# Patient Record
Sex: Female | Born: 1965 | Hispanic: No | Marital: Married | Smoking: Never smoker
Health system: Southern US, Community
[De-identification: ages and names within clinical notes are randomized; demographics above are authoritative.]

## PROBLEM LIST (undated history)

## (undated) DIAGNOSIS — K219 Gastro-esophageal reflux disease without esophagitis: Secondary | ICD-10-CM

## (undated) DIAGNOSIS — M545 Low back pain, unspecified: Secondary | ICD-10-CM

## (undated) DIAGNOSIS — I10 Essential (primary) hypertension: Secondary | ICD-10-CM

## (undated) DIAGNOSIS — K589 Irritable bowel syndrome without diarrhea: Secondary | ICD-10-CM

## (undated) DIAGNOSIS — M1990 Unspecified osteoarthritis, unspecified site: Secondary | ICD-10-CM

## (undated) DIAGNOSIS — F419 Anxiety disorder, unspecified: Secondary | ICD-10-CM

## (undated) DIAGNOSIS — G43119 Migraine with aura, intractable, without status migrainosus: Secondary | ICD-10-CM

## (undated) HISTORY — DX: Gastro-esophageal reflux disease without esophagitis: K21.9

## (undated) HISTORY — PX: OTHER SURGICAL HISTORY: SHX169

## (undated) HISTORY — DX: Anxiety disorder, unspecified: F41.9

## (undated) HISTORY — DX: Low back pain, unspecified: M54.50

## (undated) HISTORY — DX: Irritable bowel syndrome without diarrhea: K58.9

## (undated) HISTORY — DX: Essential (primary) hypertension: I10

## (undated) HISTORY — DX: Migraine with aura, intractable, without status migrainosus: G43.119

---
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
## 1999-09-03 ENCOUNTER — Other Ambulatory Visit: Admission: RE | Admit: 1999-09-03 | Discharge: 1999-09-03 | Payer: Self-pay | Admitting: Gynecology

## 2017-08-10 LAB — HM DIABETES EYE EXAM

## 2018-04-07 LAB — COLOGUARD: Cologuard: NEGATIVE

## 2018-11-06 DIAGNOSIS — M545 Low back pain, unspecified: Secondary | ICD-10-CM

## 2018-11-06 DIAGNOSIS — G8929 Other chronic pain: Secondary | ICD-10-CM | POA: Insufficient documentation

## 2018-11-06 HISTORY — DX: Other chronic pain: G89.29

## 2018-11-06 HISTORY — DX: Low back pain, unspecified: M54.50

## 2019-01-03 LAB — HM MAMMOGRAPHY: HM Mammogram: NORMAL (ref 0–4)

## 2019-01-05 LAB — HM PAP SMEAR: HM Pap smear: NORMAL

## 2019-04-23 ENCOUNTER — Ambulatory Visit: Payer: Self-pay | Admitting: Family Medicine

## 2019-05-21 ENCOUNTER — Ambulatory Visit: Payer: BC Managed Care – PPO

## 2019-05-21 ENCOUNTER — Encounter: Payer: Self-pay | Admitting: Family Medicine

## 2019-05-21 ENCOUNTER — Telehealth (INDEPENDENT_AMBULATORY_CARE_PROVIDER_SITE_OTHER): Payer: BC Managed Care – PPO | Admitting: Family Medicine

## 2019-05-21 VITALS — Temp 100.4°F | Wt 205.0 lb

## 2019-05-21 DIAGNOSIS — R197 Diarrhea, unspecified: Secondary | ICD-10-CM

## 2019-05-21 DIAGNOSIS — R05 Cough: Secondary | ICD-10-CM | POA: Diagnosis not present

## 2019-05-21 DIAGNOSIS — J018 Other acute sinusitis: Secondary | ICD-10-CM | POA: Insufficient documentation

## 2019-05-21 DIAGNOSIS — R059 Cough, unspecified: Secondary | ICD-10-CM

## 2019-05-21 MED ORDER — AMOXICILLIN 875 MG PO TABS: 875.0000 mg | ORAL_TABLET | Freq: Two times a day (BID) | ORAL | 0 refills | Status: DC

## 2019-05-21 NOTE — Patient Instructions (Signed)
Amoxillin 875 mg one twice a day.  No work until covid 19 test comes back.  Recommend fluids.   COVID-19 COVID-19 is a respiratory infection that is caused by a virus called severe acute respiratory syndrome coronavirus 2 (SARS-CoV-2). The disease is also known as coronavirus disease or novel coronavirus. In some people, the virus may not cause any symptoms. In others, it may cause a serious infection. The infection can get worse quickly and can lead to complications, such as:  Pneumonia, or infection of the lungs.  Acute respiratory distress syndrome or ARDS. This is a condition in which fluid build-up in the lungs prevents the lungs from filling with air and passing oxygen into the blood.  Acute respiratory failure. This is a condition in which there is not enough oxygen passing from the lungs to the body or when carbon dioxide is not passing from the lungs out of the body.  Sepsis or septic shock. This is a serious bodily reaction to an infection.  Blood clotting problems.  Secondary infections due to bacteria or fungus.  Organ failure. This is when your body's organs stop working. The virus that causes COVID-19 is contagious. This means that it can spread from person to person through droplets from coughs and sneezes (respiratory secretions). What are the causes? This illness is caused by a virus. You may catch the virus by:  Breathing in droplets from an infected person. Droplets can be spread by a person breathing, speaking, singing, coughing, or sneezing.  Touching something, like a table or a doorknob, that was exposed to the virus (contaminated) and then touching your mouth, nose, or eyes. What increases the risk? Risk for infection You are more likely to be infected with this virus if you:  Are within 6 feet (2 meters) of a person with COVID-19.  Provide care for or live with a person who is infected with COVID-19.  Spend time in crowded indoor spaces or live in shared  housing. Risk for serious illness You are more likely to become seriously ill from the virus if you:  Are 66 years of age or older. The higher your age, the more you are at risk for serious illness.  Live in a nursing home or long-term care facility.  Have cancer.  Have a long-term (chronic) disease such as: ? Chronic lung disease, including chronic obstructive pulmonary disease or asthma. ? A long-term disease that lowers your body's ability to fight infection (immunocompromised). ? Heart disease, including heart failure, a condition in which the arteries that lead to the heart become narrow or blocked (coronary artery disease), a disease which makes the heart muscle thick, weak, or stiff (cardiomyopathy). ? Diabetes. ? Chronic kidney disease. ? Sickle cell disease, a condition in which red blood cells have an abnormal "sickle" shape. ? Liver disease.  Are obese. What are the signs or symptoms? Symptoms of this condition can range from mild to severe. Symptoms may appear any time from 2 to 14 days after being exposed to the virus. They include:  A fever or chills.  A cough.  Difficulty breathing.  Headaches, body aches, or muscle aches.  Runny or stuffy (congested) nose.  A sore throat.  New loss of taste or smell. Some people may also have stomach problems, such as nausea, vomiting, or diarrhea. Other people may not have any symptoms of COVID-19. How is this diagnosed? This condition may be diagnosed based on:  Your signs and symptoms, especially if: ? You live in an  area with a COVID-19 outbreak. ? You recently traveled to or from an area where the virus is common. ? You provide care for or live with a person who was diagnosed with COVID-19. ? You were exposed to a person who was diagnosed with COVID-19.  A physical exam.  Lab tests, which may include: ? Taking a sample of fluid from the back of your nose and throat (nasopharyngeal fluid), your nose, or your  throat using a swab. ? A sample of mucus from your lungs (sputum). ? Blood tests.  Imaging tests, which may include, X-rays, CT scan, or ultrasound. How is this treated? At present, there is no medicine to treat COVID-19. Medicines that treat other diseases are being used on a trial basis to see if they are effective against COVID-19. Your health care provider will talk with you about ways to treat your symptoms. For most people, the infection is mild and can be managed at home with rest, fluids, and over-the-counter medicines. Treatment for a serious infection usually takes places in a hospital intensive care unit (ICU). It may include one or more of the following treatments. These treatments are given until your symptoms improve.  Receiving fluids and medicines through an IV.  Supplemental oxygen. Extra oxygen is given through a tube in the nose, a face mask, or a hood.  Positioning you to lie on your stomach (prone position). This makes it easier for oxygen to get into the lungs.  Continuous positive airway pressure (CPAP) or bi-level positive airway pressure (BPAP) machine. This treatment uses mild air pressure to keep the airways open. A tube that is connected to a motor delivers oxygen to the body.  Ventilator. This treatment moves air into and out of the lungs by using a tube that is placed in your windpipe.  Tracheostomy. This is a procedure to create a hole in the neck so that a breathing tube can be inserted.  Extracorporeal membrane oxygenation (ECMO). This procedure gives the lungs a chance to recover by taking over the functions of the heart and lungs. It supplies oxygen to the body and removes carbon dioxide. Follow these instructions at home: Lifestyle  If you are sick, stay home except to get medical care. Your health care provider will tell you how long to stay home. Call your health care provider before you go for medical care.  Rest at home as told by your health care  provider.  Do not use any products that contain nicotine or tobacco, such as cigarettes, e-cigarettes, and chewing tobacco. If you need help quitting, ask your health care provider.  Return to your normal activities as told by your health care provider. Ask your health care provider what activities are safe for you. General instructions  Take over-the-counter and prescription medicines only as told by your health care provider.  Drink enough fluid to keep your urine pale yellow.  Keep all follow-up visits as told by your health care provider. This is important. How is this prevented?  There is no vaccine to help prevent COVID-19 infection. However, there are steps you can take to protect yourself and others from this virus. To protect yourself:   Do not travel to areas where COVID-19 is a risk. The areas where COVID-19 is reported change often. To identify high-risk areas and travel restrictions, check the CDC travel website: StageSync.si  If you live in, or must travel to, an area where COVID-19 is a risk, take precautions to avoid infection. ? Stay away  from people who are sick. ? Wash your hands often with soap and water for 20 seconds. If soap and water are not available, use an alcohol-based hand sanitizer. ? Avoid touching your mouth, face, eyes, or nose. ? Avoid going out in public, follow guidance from your state and local health authorities. ? If you must go out in public, wear a cloth face covering or face mask. Make sure your mask covers your nose and mouth. ? Avoid crowded indoor spaces. Stay at least 6 feet (2 meters) away from others. ? Disinfect objects and surfaces that are frequently touched every day. This may include:  Counters and tables.  Doorknobs and light switches.  Sinks and faucets.  Electronics, such as phones, remote controls, keyboards, computers, and tablets. To protect others: If you have symptoms of COVID-19, take steps to prevent  the virus from spreading to others.  If you think you have a COVID-19 infection, contact your health care provider right away. Tell your health care team that you think you may have a COVID-19 infection.  Stay home. Leave your house only to seek medical care. Do not use public transport.  Do not travel while you are sick.  Wash your hands often with soap and water for 20 seconds. If soap and water are not available, use alcohol-based hand sanitizer.  Stay away from other members of your household. Let healthy household members care for children and pets, if possible. If you have to care for children or pets, wash your hands often and wear a mask. If possible, stay in your own room, separate from others. Use a different bathroom.  Make sure that all people in your household wash their hands well and often.  Cough or sneeze into a tissue or your sleeve or elbow. Do not cough or sneeze into your hand or into the air.  Wear a cloth face covering or face mask. Make sure your mask covers your nose and mouth. Where to find more information  Centers for Disease Control and Prevention: PurpleGadgets.be  World Health Organization: https://www.castaneda.info/ Contact a health care provider if:  You live in or have traveled to an area where COVID-19 is a risk and you have symptoms of the infection.  You have had contact with someone who has COVID-19 and you have symptoms of the infection. Get help right away if:  You have trouble breathing.  You have pain or pressure in your chest.  You have confusion.  You have bluish lips and fingernails.  You have difficulty waking from sleep.  You have symptoms that get worse. These symptoms may represent a serious problem that is an emergency. Do not wait to see if the symptoms will go away. Get medical help right away. Call your local emergency services (911 in the [place] Do not drive yourself to the hospital.  Let the emergency medical personnel know if you think you have COVID-19. Summary  COVID-19 is a respiratory infection that is caused by a virus. It is also known as coronavirus disease or novel coronavirus. It can cause serious infections, such as pneumonia, acute respiratory distress syndrome, acute respiratory failure, or sepsis.  The virus that causes COVID-19 is contagious. This means that it can spread from person to person through droplets from breathing, speaking, singing, coughing, or sneezing.  You are more likely to develop a serious illness if you are 75 years of age or older, have a weak immune system, live in a nursing home, or have chronic disease.  There is no medicine to treat COVID-19. Your health care provider will talk with you about ways to treat your symptoms.  Take steps to protect yourself and others from infection. Wash your hands often and disinfect objects and surfaces that are frequently touched every day. Stay away from people who are sick and wear a mask if you are sick. This information is not intended to replace advice given to you by your health care provider. Make sure you discuss any questions you have with your health care provider. Document Revised: 01/26/2019 Document Reviewed: 05/04/2018 Elsevier Patient Education  [employer].

## 2019-05-21 NOTE — Progress Notes (Signed)
Virtual Visit via Telephone Note   This visit type was conducted due to national recommendations for restrictions regarding the COVID-19 Pandemic (e.g. social distancing) in an effort to limit this patient's exposure and mitigate transmission in our community.  Due to her co-morbid illnesses, this patient is at least at moderate risk for complications without adequate follow up.  This format is felt to be most appropriate for this patient at this time.  The patient did have access to video technology.  All issues noted in this document were discussed and addressed.  No physical exam could be performed with this format.  Patient verbally consented to a telehealth visit.   Date:  05/25/2019   ID:  Debbie Stark, DOB Aug 26, 1965, MRN [id number]  Patient Location: Home Provider Location: Office  PCP:  Blane Ohara, MD   Evaluation Performed:  Follow-Up Visit  Chief Complaint:  sinusitis  History of Present Illness:    Debbie Stark is a 54 y.o. female with Sinus symptoms.  The patient does have symptoms concerning for COVID-19 infection (fever, chills, cough, or new shortness of breath).  The patient is complaining of nasal congestion, cough, chills, and sinus headaches. She has been febrile. She denies dypsnea or chest pain. Patient has been exposed to covid 19. 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  Her sense of taste and smell are normal.   Past Medical History:  Diagnosis Date  . Anxiety   . GERD (gastroesophageal reflux disease)   . Hypertension   . IBS (irritable bowel syndrome)    with constipation  . Low back pain   . Migraine with aura, intractable, without status migrainosus     Current Meds  Medication Sig  . AMITIZA 8 MCG capsule Take 8 mcg by mouth 2 (two) times daily.  Marland Kitchen atorvastatin (LIPITOR) 10 MG tablet Take 10 mg by mouth daily.  . cyclobenzaprine (FLEXERIL) 10 MG tablet Take 10 mg by mouth 3 (three) times daily as needed.  . famotidine  (PEPCID) 20 MG tablet Take 20 mg by mouth 2 (two) times daily.  Marland Kitchen lisinopril (ZESTRIL) 10 MG tablet Take 10 mg by mouth daily.  . meloxicam (MOBIC) 15 MG tablet Take 15 mg by mouth daily.  . promethazine (PHENERGAN) 25 MG tablet Take by mouth.  . rizatriptan (MAXALT) 10 MG tablet Take by mouth.  Marland Kitchen SAXENDA 18 MG/3ML SOPN Inject 3 mg into the skin daily.  . verapamil (CALAN-SR) 180 MG CR tablet Take 180 mg by mouth daily.  Marland Kitchen zolpidem (AMBIEN CR) 6.25 MG CR tablet Take 6.25 mg by mouth at bedtime as needed for sleep.     Allergies:   Sulfa antibiotics   Social History   Tobacco Use  . Smoking status: Never Smoker  Substance Use Topics  . Alcohol use: Yes    Comment: drinks 1-2 x per month  . Drug use: Never     Family Hx: The patient's family history includes Cancer in her mother; Diabetes in her father; Heart disease in her father; Hyperlipidemia in her father; Hypertension in her father; Stroke in her father.  ROS:   Review of Systems  Constitutional: Positive for chills, fever and malaise/fatigue.  HENT: Positive for congestion, sinus pain and sore throat. Negative for ear pain.   Respiratory: Positive for cough. Negative for shortness of breath.   Cardiovascular: Negative for chest pain.  Gastrointestinal: Positive for diarrhea.       For 2 weeks intermittently.  Musculoskeletal: Positive for myalgias.    Labs/Other Tests and Data Reviewed:    Recent Labs: No results found for requested labs within last 8760 hours.   Recent Lipid Panel No results found for: CHOL, TRIG, HDL, CHOLHDL, LDLCALC, LDLDIRECT  Wt Readings from Last 3 Encounters:  05/21/19 205 lb (93 kg)     Objective:    Vital Signs:  Temp (!) 100.4 F (38 C) Comment: 99.7 yesterday  Wt 205 lb (93 kg)   No exam done, but pt sounded hoarse on the phone.   ASSESSMENT & PLAN:    1. ACUTE MAXILLARY SINUSITIS 2. COUGH 3. DIARRHEA  COVID-19 Education: The signs and symptoms of COVID-19 were discussed  with the patient and how to seek care for testing (follow up with PCP or arrange E-visit). The importance of social distancing was discussed today.  Time:   Today, I have spent up to 10 minutes with the patient with telehealth technology discussing the above problems.     Tests Ordered: Orders Placed This Encounter  Procedures  . Novel Coronavirus, NAA ([facility])  . HM MAMMOGRAPHY  . HM PAP SMEAR  . HM DIABETES EYE EXAM    Medication Changes: Meds ordered this encounter  Medications  . amoxicillin (AMOXIL) 875 MG tablet    Sig: Take 1 tablet (875 mg total) by mouth 2 (two) times daily.    Dispense:  20 tablet    Refill:  0    Follow Up: prn  Signed, Rochel Brome, MD  05/25/2019 1:47 PM    [place]

## 2019-05-22 DIAGNOSIS — R05 Cough: Secondary | ICD-10-CM | POA: Insufficient documentation

## 2019-05-22 DIAGNOSIS — R059 Cough, unspecified: Secondary | ICD-10-CM | POA: Insufficient documentation

## 2019-05-22 DIAGNOSIS — R197 Diarrhea, unspecified: Secondary | ICD-10-CM | POA: Insufficient documentation

## 2019-05-22 LAB — NOVEL CORONAVIRUS, NAA: SARS-CoV-2, NAA: DETECTED — AB

## 2019-05-22 NOTE — Assessment & Plan Note (Signed)
Rest, fluids. Amoxicillin sent.

## 2019-05-22 NOTE — Assessment & Plan Note (Signed)
Hydration °

## 2019-05-22 NOTE — Assessment & Plan Note (Signed)
CHECKING FOR COVID 19. SXS ARE VERY CONCERNING.  MAY USE TYLENOL OR IBUPROFEN FOR FEVER.

## 2019-05-25 ENCOUNTER — Encounter: Payer: Self-pay | Admitting: Family Medicine

## 2019-06-05 ENCOUNTER — Telehealth: Payer: Self-pay

## 2019-06-05 NOTE — Telephone Encounter (Signed)
PA for Saxenda submitted and approved via covermymeds. Key: [id number]

## 2019-06-11 ENCOUNTER — Other Ambulatory Visit: Payer: Self-pay

## 2019-06-11 MED ORDER — VERAPAMIL HCL ER 180 MG PO TBCR: 180.0000 mg | EXTENDED_RELEASE_TABLET | Freq: Every day | ORAL | 0 refills | Status: DC

## 2019-06-11 MED ORDER — ATORVASTATIN CALCIUM 10 MG PO TABS: 10.0000 mg | ORAL_TABLET | Freq: Every day | ORAL | 0 refills | Status: DC

## 2019-07-12 ENCOUNTER — Other Ambulatory Visit: Payer: Self-pay

## 2019-07-12 MED ORDER — LISINOPRIL 10 MG PO TABS: 10.0000 mg | ORAL_TABLET | Freq: Every day | ORAL | 0 refills | Status: DC

## 2019-07-16 ENCOUNTER — Other Ambulatory Visit: Payer: Self-pay | Admitting: Family Medicine

## 2019-07-24 NOTE — Progress Notes (Signed)
Established Patient Office Visit  Subjective:  Patient ID: Debbie Stark, female    DOB: [birth year]  Age: 54 y.o. MRN: [id number]  CC:  Chief Complaint  Patient presents with  . Migraine  . Gastroesophageal Reflux  . Hyperlipidemia  . Hypertension  . Irritable Bowel Syndrome    HPI Pt presents for follow up of hypertension.  Date of diagnosis 05/2014.  Current nonpharmacologic treatment includes low sodium diet.  Her current cardiac medication regimen includes an ACE inhibitor ( Lisinopril 10 mg QD ) and a calcium channel blocker ( Calan SR 180 mg QD ).  Compliance with treatment has been good; she takes her medication as directed, maintains her diet and exercise regimen, and follows up as directed.  Concurrent health problems include hyperlipidemia.      Patient presents with morbid obesity Debbie Stark is currently taking Saxenda injections daily for weight loss management.  Typical diet includes low fat foods and low carbohydrate.  Debbie Stark has exercise routine.  Compliance with treatment has been good; she takes her medication as directed and follows up as directed.  Concurrent health problems include hypertension and hypercholesterolemia.  Debbie Stark has lost 16 pounds since started Saxenda. Only able to take Saxenda 1.2 mg daily. Difficulty increasing in the past to 1.8 because of belching and diarrhea.      In regard to the migraine with aura, not intractable, without status migrainosus, Debbie Stark was diagnosed with migraine headaches several years ago.  She has had prior headaches similar to this one. Typical migraine frequency is roughly every few weeks.   The duration of each episode is usually quite variable.  Typical precipitating factors include no identified factors.  The pain improves with Relpax and calan sr.  Compliance with treatment has been good; she takes her medication as directed.    Pertinent past medical history includes migraines and hypertension.      Pt presents with  hyperlipidemia.  Date of diagnosis 11/2011.  Current treatment includes Lipitor, exercise, and a low cholesterol/low fat diet.  Compliance with treatment has been good; she takes her medication as directed and follows up as directed.   Concurrent health problems include hypertension.      With regard to the irritable bowel syndrome with constipation, irritable Bowel Syndrome has been a problem for the past more than 5 years.  Has stopped linzess because was concerned because was having belching and diarrhea. Discontinued. Now is concerned saxenda at highest dose may be doing it.   Past Medical History:  Diagnosis Date  . Anxiety   . GERD (gastroesophageal reflux disease)   . Hypertension   . IBS (irritable bowel syndrome)    with constipation  . Low back pain   . Migraine with aura, intractable, without status migrainosus     History reviewed. No pertinent surgical history.  Family History  Problem Relation Age of Onset  . Cancer Mother        endometrial  . Diabetes Father   . Hyperlipidemia Father   . Hypertension Father   . Heart disease Father   . Stroke Father     Social History   Socioeconomic History  . Marital status: Unknown    Spouse name: Not on file  . Number of children: Not on file  . Years of education: Not on file  . Highest education level: Not on file  Occupational History  . Not on file  Tobacco Use  . Smoking status: Never Smoker  . Smokeless tobacco: Never  Used  Substance and Sexual Activity  . Alcohol use: Yes    Comment: drinks 1-2 x per month  . Drug use: Never  . Sexual activity: Not on file  Other Topics Concern  . Not on file  Social History Narrative  . Not on file   Social Determinants of Health   Financial Resource Strain:   . Difficulty of Paying Living Expenses:   Food Insecurity:   . Worried About Programme researcher, broadcasting/film/video in the Last Year:   . Barista in the Last Year:   Transportation Needs:   . Freight forwarder  (Medical):   Marland Kitchen Lack of Transportation (Non-Medical):   Physical Activity:   . Days of Exercise per Week:   . Minutes of Exercise per Session:   Stress:   . Feeling of Stress :   Social Connections:   . Frequency of Communication with Friends and Family:   . Frequency of Social Gatherings with Friends and Family:   . Attends Religious Services:   . Active Member of Clubs or Organizations:   . Attends Banker Meetings:   Marland Kitchen Marital Status:   Intimate Partner Violence:   . Fear of Current or Ex-Partner:   . Emotionally Abused:   Marland Kitchen Physically Abused:   . Sexually Abused:     Outpatient Medications Prior to Visit  Medication Sig Dispense Refill  . AMITIZA 8 MCG capsule Take 8 mcg by mouth 2 (two) times daily.    Marland Kitchen atorvastatin (LIPITOR) 10 MG tablet Take 1 tablet (10 mg total) by mouth daily. 90 tablet 0  . cyclobenzaprine (FLEXERIL) 10 MG tablet Take 10 mg by mouth 3 (three) times daily as needed.    . famotidine (PEPCID) 20 MG tablet TAKE 1 TABLET BY MOUTH TWICE A DAY 180 tablet 0  . levonorgestrel (MIRENA) 20 MCG/24HR IUD by Intrauterine route.    Marland Kitchen lisinopril (ZESTRIL) 10 MG tablet Take 1 tablet (10 mg total) by mouth daily. 90 tablet 0  . meloxicam (MOBIC) 15 MG tablet TAKE 1 TABLET BY MOUTH EVERY DAY 90 tablet 0  . promethazine (PHENERGAN) 25 MG tablet Take by mouth.    . rizatriptan (MAXALT) 10 MG tablet Take by mouth.    Marland Kitchen SAXENDA 18 MG/3ML SOPN Inject 3 mg into the skin daily.    . verapamil (CALAN-SR) 180 MG CR tablet Take 1 tablet (180 mg total) by mouth daily. 90 tablet 0  . zolpidem (AMBIEN CR) 6.25 MG CR tablet Take 6.25 mg by mouth at bedtime as needed for sleep.    Marland Kitchen amoxicillin (AMOXIL) 875 MG tablet Take 1 tablet (875 mg total) by mouth 2 (two) times daily. 20 tablet 0   No facility-administered medications prior to visit.    Allergies  Allergen Reactions  . Sulfa Antibiotics     ROS Review of Systems  Constitutional: Negative for chills,  diaphoresis, fatigue and fever.  HENT: Negative for congestion, ear pain, rhinorrhea and sore throat.   Respiratory: Negative for cough and shortness of breath.   Cardiovascular: Negative for chest pain.  Gastrointestinal: Negative for abdominal pain, nausea and vomiting. Diarrhea: Every time she titrates up on Saxenda.  Genitourinary: Negative for dysuria and urgency.  Musculoskeletal: Positive for arthralgias and back pain. Negative for myalgias.       Right medial ankle painful and swollen since Sunday. Took meloxicam on Sunday. Helped a little. Low back pain intermittent. Uses meloxicam intermittently and it helps.   Neurological:  Dizziness: Sometimes.       Had a spell of feeling weak and felt like she was going to pass out. Ate a little something and laid down and felt better after about 15 minutes. Had a headache right frontal area radiating across frontal to left side.   Psychiatric/Behavioral: Negative for dysphoric mood. The patient is not nervous/anxious.       Objective:    Physical Exam  Constitutional: She appears well-developed and well-nourished.  Cardiovascular: Normal rate, regular rhythm and normal heart sounds.  Pulmonary/Chest: Effort normal and breath sounds normal.  Abdominal: Soft. Bowel sounds are normal. There is no abdominal tenderness.  Musculoskeletal:        General: Tenderness present.     Comments: No swelling. Right ankle tenderness.   Neurological: She is alert.  Psychiatric: She has a normal mood and affect. Her behavior is normal.    BP 130/80 (BP Location: Left Arm, Patient Position: Sitting)   Pulse 78   Temp (!) 95.3 F (35.2 C) (Temporal)   Ht 5\' 5"  (1.651 m)   Wt 206 lb (93.4 kg)   SpO2 95%   BMI 34.28 kg/m  Wt Readings from Last 3 Encounters:  07/25/19 206 lb (93.4 kg)  05/21/19 205 lb (93 kg)     Health Maintenance Due  Topic Date Due  . HIV Screening  Never done  . COVID-19 Vaccine (1) Never done  . TETANUS/TDAP  Never done   . COLONOSCOPY  Never done    There are no preventive care reminders to display for this patient.  No results found for: TSH Lab Results  Component Value Date   WBC 6.6 07/25/2019   HGB 13.6 07/25/2019   HCT 41.5 07/25/2019   MCV 98 (H) 07/25/2019   PLT 215 07/25/2019   Lab Results  Component Value Date   NA 140 07/25/2019   K 4.8 07/25/2019   CO2 22 07/25/2019   GLUCOSE 86 07/25/2019   BUN 16 07/25/2019   CREATININE 1.03 (H) 07/25/2019   BILITOT 0.5 07/25/2019   ALKPHOS 69 07/25/2019   AST 11 07/25/2019   ALT 8 07/25/2019   PROT 7.2 07/25/2019   ALBUMIN 4.4 07/25/2019   CALCIUM 9.7 07/25/2019   Lab Results  Component Value Date   CHOL 131 07/25/2019   Lab Results  Component Value Date   HDL 47 07/25/2019   Lab Results  Component Value Date   LDLCALC 72 07/25/2019   Lab Results  Component Value Date   TRIG 52 07/25/2019   Lab Results  Component Value Date   CHOLHDL 2.8 07/25/2019   No results found for: HGBA1C    Assessment & Plan:  1. Essential hypertension Well controlled.  No changes to medicines.  Continue to work on eating a healthy diet and exercise.  Labs drawn today. - CBC with Differential/Platelet - Comprehensive metabolic panel  2. Mixed hyperlipidemia Well controlled.  No changes to medicines.  Continue to work on eating a healthy diet and exercise.  Labs drawn today.  - Lipid panel  3. Acute right ankle pain Wear supportive shoes. Start meloxicm 15 mg once daily.   4. Irritable bowel syndrome with diarrhea Continue amitiza.  5. Class 1 obesity due to excess calories with serious comorbidity and body mass index (BMI) of 34.0 to 34.9 in adult  Continue Saxenda. Recommend pt try 1.2 mg in am and 0.6 mg in evening. Continue to eat healthy and exercise.     Follow-up: Return in  about 3 months (around 10/24/2019) for fasting.    Blane Ohara, MD

## 2019-07-25 ENCOUNTER — Encounter: Payer: Self-pay | Admitting: Family Medicine

## 2019-07-25 ENCOUNTER — Other Ambulatory Visit: Payer: Self-pay

## 2019-07-25 ENCOUNTER — Ambulatory Visit: Payer: BC Managed Care – PPO | Admitting: Family Medicine

## 2019-07-25 VITALS — BP 130/80 | HR 78 | Temp 95.3°F | Ht 65.0 in | Wt 206.0 lb

## 2019-07-25 DIAGNOSIS — M25571 Pain in right ankle and joints of right foot: Secondary | ICD-10-CM

## 2019-07-25 DIAGNOSIS — K580 Irritable bowel syndrome with diarrhea: Secondary | ICD-10-CM | POA: Insufficient documentation

## 2019-07-25 DIAGNOSIS — E782 Mixed hyperlipidemia: Secondary | ICD-10-CM

## 2019-07-25 DIAGNOSIS — Z6834 Body mass index (BMI) 34.0-34.9, adult: Secondary | ICD-10-CM

## 2019-07-25 DIAGNOSIS — I10 Essential (primary) hypertension: Secondary | ICD-10-CM

## 2019-07-25 DIAGNOSIS — E6609 Other obesity due to excess calories: Secondary | ICD-10-CM

## 2019-07-25 DIAGNOSIS — K219 Gastro-esophageal reflux disease without esophagitis: Secondary | ICD-10-CM | POA: Insufficient documentation

## 2019-07-25 HISTORY — DX: Mixed hyperlipidemia: E78.2

## 2019-07-25 HISTORY — DX: Essential (primary) hypertension: I10

## 2019-07-25 NOTE — Patient Instructions (Addendum)
Increase Saxenda to 1.2 in am and 0.6 in evening.  Right ankle - wear supportive shoes and meloxicam 15 mg once daily. Continue other medications.  DASH Eating Plan DASH stands for "Dietary Approaches to Stop Hypertension." The DASH eating plan is a healthy eating plan that has been shown to reduce high blood pressure (hypertension). It may also reduce your risk for type 2 diabetes, heart disease, and stroke. The DASH eating plan may also help with weight loss. What are tips for following this plan?  General guidelines  Avoid eating more than 2,300 mg (milligrams) of salt (sodium) a day. If you have hypertension, you may need to reduce your sodium intake to 1,500 mg a day.  Limit alcohol intake to no more than 1 drink a day for nonpregnant women and 2 drinks a day for men. One drink equals 12 oz of beer, 5 oz of wine, or 1 oz of hard liquor.  Work with your health care provider to maintain a healthy body weight or to lose weight. Ask what an ideal weight is for you.  Get at least 30 minutes of exercise that causes your heart to beat faster (aerobic exercise) most days of the week. Activities may include walking, swimming, or biking.  Work with your health care provider or diet and nutrition specialist (dietitian) to adjust your eating plan to your individual calorie needs. Reading food labels   Check food labels for the amount of sodium per serving. Choose foods with less than 5 percent of the Daily Value of sodium. Generally, foods with less than 300 mg of sodium per serving fit into this eating plan.  To find whole grains, look for the word "whole" as the first word in the ingredient list. Shopping  Buy products labeled as "low-sodium" or "no salt added."  Buy fresh foods. Avoid canned foods and premade or frozen meals. Cooking  Avoid adding salt when cooking. Use salt-free seasonings or herbs instead of table salt or sea salt. Check with your health care provider or pharmacist  before using salt substitutes.  Do not fry foods. Cook foods using healthy methods such as baking, boiling, grilling, and broiling instead.  Cook with heart-healthy oils, such as olive, canola, soybean, or sunflower oil. Meal planning  Eat a balanced diet that includes: ? 5 or more servings of fruits and vegetables each day. At each meal, try to fill half of your plate with fruits and vegetables. ? Up to 6-8 servings of whole grains each day. ? Less than 6 oz of lean meat, poultry, or fish each day. A 3-oz serving of meat is about the same size as a deck of cards. One egg equals 1 oz. ? 2 servings of low-fat dairy each day. ? A serving of nuts, seeds, or beans 5 times each week. ? Heart-healthy fats. Healthy fats called Omega-3 fatty acids are found in foods such as flaxseeds and coldwater fish, like sardines, salmon, and mackerel.  Limit how much you eat of the following: ? Canned or prepackaged foods. ? Food that is high in trans fat, such as fried foods. ? Food that is high in saturated fat, such as fatty meat. ? Sweets, desserts, sugary drinks, and other foods with added sugar. ? Full-fat dairy products.  Do not salt foods before eating.  Try to eat at least 2 vegetarian meals each week.  Eat more home-cooked food and less restaurant, buffet, and fast food.  When eating at a restaurant, ask that your food be  prepared with less salt or no salt, if possible. What foods are recommended? The items listed may not be a complete list. Talk with your dietitian about what dietary choices are best for you. Grains Whole-grain or whole-wheat bread. Whole-grain or whole-wheat pasta. Brown rice. [place]. Bulgur. Whole-grain and low-sodium cereals. Pita bread. Low-fat, low-sodium crackers. Whole-wheat flour tortillas. Vegetables Fresh or frozen vegetables (raw, steamed, roasted, or grilled). Low-sodium or reduced-sodium tomato and vegetable juice. Low-sodium or reduced-sodium tomato  sauce and tomato paste. Low-sodium or reduced-sodium canned vegetables. Fruits All fresh, dried, or frozen fruit. Canned fruit in natural juice (without added sugar). Meat and other protein foods Skinless chicken or [place]. Ground chicken or [place]. Pork with fat trimmed off. Fish and seafood. Egg whites. Dried beans, peas, or lentils. Unsalted nuts, nut butters, and seeds. Unsalted canned beans. Lean cuts of beef with fat trimmed off. Low-sodium, lean deli meat. Dairy Low-fat (1%) or fat-free (skim) milk. Fat-free, low-fat, or reduced-fat cheeses. Nonfat, low-sodium ricotta or cottage cheese. Low-fat or nonfat yogurt. Low-fat, low-sodium cheese. Fats and oils Soft margarine without trans fats. Vegetable oil. Low-fat, reduced-fat, or light mayonnaise and salad dressings (reduced-sodium). Canola, safflower, olive, soybean, and sunflower oils. Avocado. Seasoning and other foods Herbs. Spices. Seasoning mixes without salt. Unsalted popcorn and pretzels. Fat-free sweets. What foods are not recommended? The items listed may not be a complete list. Talk with your dietitian about what dietary choices are best for you. Grains Baked goods made with fat, such as croissants, muffins, or some breads. Dry pasta or rice meal packs. Vegetables Creamed or fried vegetables. Vegetables in a cheese sauce. Regular canned vegetables (not low-sodium or reduced-sodium). Regular canned tomato sauce and paste (not low-sodium or reduced-sodium). Regular tomato and vegetable juice (not low-sodium or reduced-sodium). Angie Fava. Olives. Fruits Canned fruit in a light or heavy syrup. Fried fruit. Fruit in cream or butter sauce. Meat and other protein foods Fatty cuts of meat. Ribs. Fried meat. Berniece Salines. Sausage. Bologna and other processed lunch meats. Salami. Fatback. Hotdogs. Bratwurst. Salted nuts and seeds. Canned beans with added salt. Canned or smoked fish. Whole eggs or egg yolks. Chicken or [place] with skin. Dairy Whole  or 2% milk, cream, and half-and-half. Whole or full-fat cream cheese. Whole-fat or sweetened yogurt. Full-fat cheese. Nondairy creamers. Whipped toppings. Processed cheese and cheese spreads. Fats and oils Butter. Stick margarine. Lard. Shortening. Ghee. Bacon fat. Tropical oils, such as coconut, palm kernel, or palm oil. Seasoning and other foods Salted popcorn and pretzels. Onion salt, garlic salt, seasoned salt, table salt, and sea salt. Worcestershire sauce. Tartar sauce. Barbecue sauce. Teriyaki sauce. Soy sauce, including reduced-sodium. Steak sauce. Canned and packaged gravies. Fish sauce. Oyster sauce. Cocktail sauce. Horseradish that you find on the shelf. Ketchup. Mustard. Meat flavorings and tenderizers. Bouillon cubes. Hot sauce and Tabasco sauce. Premade or packaged marinades. Premade or packaged taco seasonings. Relishes. Regular salad dressings. Where to find more information:  National Heart, Lung, and [place]: https://wilson-eaton.com/  American Heart Association: www.heart.org Summary  The DASH eating plan is a healthy eating plan that has been shown to reduce high blood pressure (hypertension). It may also reduce your risk for type 2 diabetes, heart disease, and stroke.  With the DASH eating plan, you should limit salt (sodium) intake to 2,300 mg a day. If you have hypertension, you may need to reduce your sodium intake to 1,500 mg a day.  When on the DASH eating plan, aim to eat more fresh fruits and vegetables, whole grains,  lean proteins, low-fat dairy, and heart-healthy fats.  Work with your health care provider or diet and nutrition specialist (dietitian) to adjust your eating plan to your individual calorie needs. This information is not intended to replace advice given to you by your health care provider. Make sure you discuss any questions you have with your health care provider. Document Revised: 03/11/2017 Document Reviewed: 03/22/2016 Elsevier Patient Education   2020 [employer].  Diabetes Mellitus and Nutrition, Adult When you have diabetes (diabetes mellitus), it is very important to have healthy eating habits because your blood sugar (glucose) levels are greatly affected by what you eat and drink. Eating healthy foods in the appropriate amounts, at about the same times every day, can help you:  Control your blood glucose.  Lower your risk of heart disease.  Improve your blood pressure.  Reach or maintain a healthy weight. Every person with diabetes is different, and each person has different needs for a meal plan. Your health care provider may recommend that you work with a diet and nutrition specialist (dietitian) to make a meal plan that is best for you. Your meal plan may vary depending on factors such as:  The calories you need.  The medicines you take.  Your weight.  Your blood glucose, blood pressure, and cholesterol levels.  Your activity level.  Other health conditions you have, such as heart or kidney disease. How do carbohydrates affect me? Carbohydrates, also called carbs, affect your blood glucose level more than any other type of food. Eating carbs naturally raises the amount of glucose in your blood. Carb counting is a method for keeping track of how many carbs you eat. Counting carbs is important to keep your blood glucose at a healthy level, especially if you use insulin or take certain oral diabetes medicines. It is important to know how many carbs you can safely have in each meal. This is different for every person. Your dietitian can help you calculate how many carbs you should have at each meal and for each snack. Foods that contain carbs include:  Bread, cereal, rice, pasta, and crackers.  Potatoes and corn.  Peas, beans, and lentils.  Milk and yogurt.  Fruit and juice.  Desserts, such as cakes, cookies, ice cream, and candy. How does alcohol affect me? Alcohol can cause a sudden decrease in blood glucose  (hypoglycemia), especially if you use insulin or take certain oral diabetes medicines. Hypoglycemia can be a life-threatening condition. Symptoms of hypoglycemia (sleepiness, dizziness, and confusion) are similar to symptoms of having too much alcohol. If your health care provider says that alcohol is safe for you, follow these guidelines:  Limit alcohol intake to no more than 1 drink per day for nonpregnant women and 2 drinks per day for men. One drink equals 12 oz of beer, 5 oz of wine, or 1 oz of hard liquor.  Do not drink on an empty stomach.  Keep yourself hydrated with water, diet soda, or unsweetened iced tea.  Keep in mind that regular soda, juice, and other mixers may contain a lot of sugar and must be counted as carbs. What are tips for following this plan?  Reading food labels  Start by checking the serving size on the "Nutrition Facts" label of packaged foods and drinks. The amount of calories, carbs, fats, and other nutrients listed on the label is based on one serving of the item. Many items contain more than one serving per package.  Check the total grams (g) of  carbs in one serving. You can calculate the number of servings of carbs in one serving by dividing the total carbs by 15. For example, if a food has 30 g of total carbs, it would be equal to 2 servings of carbs.  Check the number of grams (g) of saturated and trans fats in one serving. Choose foods that have low or no amount of these fats.  Check the number of milligrams (mg) of salt (sodium) in one serving. Most people should limit total sodium intake to less than 2,300 mg per day.  Always check the nutrition information of foods labeled as "low-fat" or "nonfat". These foods may be higher in added sugar or refined carbs and should be avoided.  Talk to your dietitian to identify your daily goals for nutrients listed on the label. Shopping  Avoid buying canned, premade, or processed foods. These foods tend to be high  in fat, sodium, and added sugar.  Shop around the outside edge of the grocery store. This includes fresh fruits and vegetables, bulk grains, fresh meats, and fresh dairy. Cooking  Use low-heat cooking methods, such as baking, instead of high-heat cooking methods like deep frying.  Cook using healthy oils, such as olive, canola, or sunflower oil.  Avoid cooking with butter, cream, or high-fat meats. Meal planning  Eat meals and snacks regularly, preferably at the same times every day. Avoid going long periods of time without eating.  Eat foods high in fiber, such as fresh fruits, vegetables, beans, and whole grains. Talk to your dietitian about how many servings of carbs you can eat at each meal.  Eat 4-6 ounces (oz) of lean protein each day, such as lean meat, chicken, fish, eggs, or tofu. One oz of lean protein is equal to: ? 1 oz of meat, chicken, or fish. ? 1 egg. ?  cup of tofu.  Eat some foods each day that contain healthy fats, such as avocado, nuts, seeds, and fish. Lifestyle  Check your blood glucose regularly.  Exercise regularly as told by your health care provider. This may include: ? 150 minutes of moderate-intensity or vigorous-intensity exercise each week. This could be brisk walking, biking, or water aerobics. ? Stretching and doing strength exercises, such as yoga or weightlifting, at least 2 times a week.  Take medicines as told by your health care provider.  Do not use any products that contain nicotine or tobacco, such as cigarettes and e-cigarettes. If you need help quitting, ask your health care provider.  Work with a Veterinary surgeon or diabetes educator to identify strategies to manage stress and any emotional and social challenges. Questions to ask a health care provider  Do I need to meet with a diabetes educator?  Do I need to meet with a dietitian?  What number can I call if I have questions?  When are the best times to check my blood glucose? Where to  find more information:  American Diabetes Association: diabetes.org  Academy of Nutrition and Dietetics: www.eatright.[employer] of Diabetes and Digestive and Kidney Diseases (NIH): CarFlippers.tn Summary  A healthy meal plan will help you control your blood glucose and maintain a healthy lifestyle.  Working with a diet and nutrition specialist (dietitian) can help you make a meal plan that is best for you.  Keep in mind that carbohydrates (carbs) and alcohol have immediate effects on your blood glucose levels. It is important to count carbs and to use alcohol carefully. This information is not intended to replace  advice given to you by your health care provider. Make sure you discuss any questions you have with your health care provider. Document Revised: 03/11/2017 Document Reviewed: 05/03/2016 Elsevier Patient Education  2020 [employer].

## 2019-07-26 LAB — COMPREHENSIVE METABOLIC PANEL
ALT: 8 IU/L (ref 0–32)
AST: 11 IU/L (ref 0–40)
Albumin/Globulin Ratio: 1.6 (ref 1.2–2.2)
Albumin: 4.4 g/dL (ref 3.8–4.9)
Alkaline Phosphatase: 69 IU/L (ref 39–117)
BUN/Creatinine Ratio: 16 (ref 9–23)
BUN: 16 mg/dL (ref 6–24)
Bilirubin Total: 0.5 mg/dL (ref 0.0–1.2)
CO2: 22 mmol/L (ref 20–29)
Calcium: 9.7 mg/dL (ref 8.7–10.2)
Chloride: 103 mmol/L (ref 96–106)
Creatinine, Ser: 1.03 mg/dL — ABNORMAL HIGH (ref 0.57–1.00)
GFR calc Af Amer: 73 mL/min/{1.73_m2} (ref >59)
GFR calc non Af Amer: 63 mL/min/{1.73_m2} (ref >59)
Globulin, Total: 2.8 g/dL (ref 1.5–4.5)
Glucose: 86 mg/dL (ref 65–99)
Potassium: 4.8 mmol/L (ref 3.5–5.2)
Sodium: 140 mmol/L (ref 134–144)
Total Protein: 7.2 g/dL (ref 6.0–8.5)

## 2019-07-26 LAB — CBC WITH DIFFERENTIAL/PLATELET
Basophils Absolute: 0 10*3/uL (ref 0.0–0.2)
Basos: 1 %
EOS (ABSOLUTE): 0.2 10*3/uL (ref 0.0–0.4)
Eos: 3 %
Hematocrit: 41.5 % (ref 34.0–46.6)
Hemoglobin: 13.6 g/dL (ref 11.1–15.9)
Immature Grans (Abs): 0 10*3/uL (ref 0.0–0.1)
Immature Granulocytes: 0 %
Lymphocytes Absolute: 1.5 10*3/uL (ref 0.7–3.1)
Lymphs: 23 %
MCH: 32.2 pg (ref 26.6–33.0)
MCHC: 32.8 g/dL (ref 31.5–35.7)
MCV: 98 fL — ABNORMAL HIGH (ref 79–97)
Monocytes Absolute: 0.4 10*3/uL (ref 0.1–0.9)
Monocytes: 6 %
Neutrophils Absolute: 4.4 10*3/uL (ref 1.4–7.0)
Neutrophils: 67 %
Platelets: 215 10*3/uL (ref 150–450)
RBC: 4.23 x10E6/uL (ref 3.77–5.28)
RDW: 12.1 % (ref 11.7–15.4)
WBC: 6.6 10*3/uL (ref 3.4–10.8)

## 2019-07-26 LAB — CARDIOVASCULAR RISK ASSESSMENT

## 2019-07-26 LAB — LIPID PANEL
Chol/HDL Ratio: 2.8 ratio (ref 0.0–4.4)
Cholesterol, Total: 131 mg/dL (ref 100–199)
HDL: 47 mg/dL (ref >39)
LDL Chol Calc (NIH): 72 mg/dL (ref 0–99)
Triglycerides: 52 mg/dL (ref 0–149)
VLDL Cholesterol Cal: 12 mg/dL (ref 5–40)

## 2019-07-29 DIAGNOSIS — Z6838 Body mass index (BMI) 38.0-38.9, adult: Secondary | ICD-10-CM

## 2019-07-29 DIAGNOSIS — E66812 Obesity, class 2: Secondary | ICD-10-CM

## 2019-07-29 DIAGNOSIS — E6609 Other obesity due to excess calories: Secondary | ICD-10-CM | POA: Insufficient documentation

## 2019-07-29 HISTORY — DX: Body mass index (BMI) 38.0-38.9, adult: Z68.38

## 2019-07-29 HISTORY — DX: Morbid (severe) obesity due to excess calories: E66.01

## 2019-07-29 HISTORY — DX: Obesity, class 2: E66.812

## 2019-08-20 ENCOUNTER — Other Ambulatory Visit: Payer: Self-pay

## 2019-08-21 MED ORDER — SAXENDA 18 MG/3ML ~~LOC~~ SOPN: 3.0000 mg | PEN_INJECTOR | Freq: Every day | SUBCUTANEOUS | 2 refills | Status: DC

## 2019-08-24 ENCOUNTER — Other Ambulatory Visit: Payer: Self-pay

## 2019-08-24 MED ORDER — FAMOTIDINE 20 MG PO TABS: 20.0000 mg | ORAL_TABLET | Freq: Two times a day (BID) | ORAL | 0 refills | Status: DC

## 2019-09-01 ENCOUNTER — Other Ambulatory Visit: Payer: Self-pay | Admitting: Family Medicine

## 2019-10-02 ENCOUNTER — Other Ambulatory Visit: Payer: Self-pay | Admitting: Family Medicine

## 2019-10-09 ENCOUNTER — Other Ambulatory Visit: Payer: Self-pay | Admitting: Family Medicine

## 2019-10-29 ENCOUNTER — Other Ambulatory Visit: Payer: Self-pay

## 2019-10-29 ENCOUNTER — Ambulatory Visit: Payer: BC Managed Care – PPO | Admitting: Family Medicine

## 2019-10-29 ENCOUNTER — Encounter: Payer: Self-pay | Admitting: Family Medicine

## 2019-10-29 VITALS — BP 128/74 | HR 78 | Temp 97.6°F | Resp 18 | Ht 65.0 in | Wt 217.8 lb

## 2019-10-29 DIAGNOSIS — E782 Mixed hyperlipidemia: Secondary | ICD-10-CM

## 2019-10-29 DIAGNOSIS — R1084 Generalized abdominal pain: Secondary | ICD-10-CM

## 2019-10-29 DIAGNOSIS — I10 Essential (primary) hypertension: Secondary | ICD-10-CM | POA: Diagnosis not present

## 2019-10-29 MED ORDER — OMEPRAZOLE 40 MG PO CPDR
40.0000 mg | DELAYED_RELEASE_CAPSULE | Freq: Every day | ORAL | 3 refills | Status: DC
Start: 2019-10-29 — End: 2020-02-04

## 2019-10-29 NOTE — Patient Instructions (Addendum)
Avoid preservatives in food, go with fresh or frozen foods. Avoid spicy foods. Miralax every other day to aide in constipation. Gallbladder ultrasound to evaluate upper abdominal pain if pain continues.  Food Choices for Gastroesophageal Reflux Disease, Adult When you have gastroesophageal reflux disease (GERD), the foods you eat and your eating habits are very important. Choosing the right foods can help ease your discomfort. Think about working with a nutrition specialist (dietitian) to help you make good choices. What are tips for following this plan?  Meals  Choose healthy foods that are low in fat, such as fruits, vegetables, whole grains, low-fat dairy products, and lean meat, fish, and poultry.  Eat small meals often instead of 3 large meals a day. Eat your meals slowly, and in a place where you are relaxed. Avoid bending over or lying down until 2-3 hours after eating.  Avoid eating meals 2-3 hours before bed.  Avoid drinking a lot of liquid with meals.  Cook foods using methods other than frying. Bake, grill, or broil food instead.  Avoid or limit: ? Chocolate. ? Peppermint or spearmint. ? Alcohol. ? Pepper. ? Black and decaffeinated coffee. ? Black and decaffeinated tea. ? Bubbly (carbonated) soft drinks. ? Caffeinated energy drinks and soft drinks.  Limit high-fat foods such as: ? Fatty meat or fried foods. ? Whole milk, cream, butter, or ice cream. ? Nuts and nut butters. ? Pastries, donuts, and sweets made with butter or shortening.  Avoid foods that cause symptoms. These foods may be different for everyone. Common foods that cause symptoms include: ? Tomatoes. ? Oranges, lemons, and limes. ? Peppers. ? Spicy food. ? Onions and garlic. ? Vinegar. Lifestyle  Maintain a healthy weight. Ask your doctor what weight is healthy for you. If you need to lose weight, work with your doctor to do so safely.  Exercise for at least 30 minutes for 5 or more days each  week, or as told by your doctor.  Wear loose-fitting clothes.  Do not smoke. If you need help quitting, ask your doctor.  Sleep with the head of your bed higher than your feet. Use a wedge under the mattress or blocks under the bed frame to raise the head of the bed. Summary  When you have gastroesophageal reflux disease (GERD), food and lifestyle choices are very important in easing your symptoms.  Eat small meals often instead of 3 large meals a day. Eat your meals slowly, and in a place where you are relaxed.  Limit high-fat foods such as fatty meat or fried foods.  Avoid bending over or lying down until 2-3 hours after eating.  Avoid peppermint and spearmint, caffeine, alcohol, and chocolate. This information is not intended to replace advice given to you by your health care provider. Make sure you discuss any questions you have with your health care provider. Document Revised: 07/20/2018 Document Reviewed: 05/04/2016 Elsevier Patient Education  2020 Else[employer] Liraglutide injection (Weight Management) What is this medicine? LIRAGLUTIDE (LIR a GLOO tide) is used to help people lose weight and maintain weight loss. It is used with a reduced-calorie diet and exercise. This medicine may be used for other purposes; ask your health care provider or pharmacist if you have questions. COMMON BRAND NAME(S): Saxenda What should I tell my health care provider before I take this medicine? They need to know if you have any of these conditions:  endocrine tumors (MEN 2) or if someone in your family had these tumors  gallbladder disease  high cholesterol  history of alcohol abuse problem  history of pancreatitis  kidney disease or if you are on dialysis  liver disease  previous swelling of the tongue, face, or lips with difficulty breathing, difficulty swallowing, hoarseness, or tightening of the throat  stomach problems  suicidal thoughts, plans, or attempt; a previous  suicide attempt by you or a family member  thyroid cancer or if someone in your family had thyroid cancer  an unusual or allergic reaction to liraglutide, other medicines, foods, dyes, or preservatives  pregnant or trying to get pregnant  breast-feeding How should I use this medicine? This medicine is for injection under the skin of your upper leg, stomach area, or upper arm. You will be taught how to prepare and give this medicine. Use exactly as directed. Take your medicine at regular intervals. Do not take it more often than directed. This drug comes with INSTRUCTIONS FOR USE. Ask your pharmacist for directions on how to use this drug. Read the information carefully. Talk to your pharmacist or health care provider if you have questions. It is important that you put your used needles and syringes in a special sharps container. Do not put them in a trash can. If you do not have a sharps container, call your pharmacist or healthcare provider to get one. A special MedGuide will be given to you by the pharmacist with each prescription and refill. Be sure to read this information carefully each time. Talk to your pediatrician regarding the use of this medicine in children. Special care may be needed. Overdosage: If you think you have taken too much of this medicine contact a poison control center or emergency room at once. NOTE: This medicine is only for you. Do not share this medicine with others. What if I miss a dose? If you miss a dose, take it as soon as you can. If it is almost time for your next dose, take only that dose. Do not take double or extra doses. If you miss your dose for 3 days or more, call your doctor or health care professional to talk about how to restart this medicine. What may interact with this medicine?  insulin and other medicines for diabetes This list may not describe all possible interactions. Give your health care provider a list of all the medicines, herbs,  non-prescription drugs, or dietary supplements you use. Also tell them if you smoke, drink alcohol, or use illegal drugs. Some items may interact with your medicine. What should I watch for while using this medicine? Visit your doctor or health care professional for regular checks on your progress. Drink plenty of fluids while taking this medicine. Check with your doctor or health care professional if you get an attack of severe diarrhea, nausea, and vomiting. The loss of too much body fluid can make it dangerous for you to take this medicine. This medicine may affect blood sugar levels. Ask your healthcare provider if changes in diet or medicines are needed if you have diabetes. Patients and their families should watch out for worsening depression or thoughts of suicide. Also watch out for sudden changes in feelings such as feeling anxious, agitated, panicky, irritable, hostile, aggressive, impulsive, severely restless, overly excited and hyperactive, or not being able to sleep. If this happens, especially at the beginning of treatment or after a change in dose, call your health care professional. Women should inform their health care provider if they wish to become pregnant or think they might be pregnant. Losing  weight while pregnant is not advised and may cause harm to the unborn child. Talk to your health care provider for more information. What side effects may I notice from receiving this medicine? Side effects that you should report to your doctor or health care professional as soon as possible:  allergic reactions like skin rash, itching or hives, swelling of the face, lips, or tongue  breathing problems  diarrhea that continues or is severe  lump or swelling on the neck  severe nausea  signs and symptoms of infection like fever or chills; cough; sore throat; pain or trouble passing urine  signs and symptoms of low blood sugar such as feeling anxious; confusion; dizziness; increased  hunger; unusually weak or tired; increased sweating; shakiness; cold, clammy skin; irritable; headache; blurred vision; fast heartbeat; loss of consciousness  signs and symptoms of kidney injury like trouble passing urine or change in the amount of urine  trouble swallowing  unusual stomach upset or pain  vomiting Side effects that usually do not require medical attention (report to your doctor or health care professional if they continue or are bothersome):  constipation  decreased appetite  diarrhea  fatigue  headache  nausea  pain, redness, or irritation at site where injected  stomach upset  stuffy or runny nose This list may not describe all possible side effects. Call your doctor for medical advice about side effects. You may report side effects to FDA at 1-800-FDA-1088. Where should I keep my medicine? Keep out of the reach of children. Store unopened pen in a refrigerator between 2 and 8 degrees C (36 and 46 degrees F). Do not freeze or use if the medicine has been frozen. Protect from light and excessive heat. After you first use the pen, it can be stored at room temperature between 15 and 30 degrees C (59 and 86 degrees F) or in a refrigerator. Throw away your used pen after 30 days or after the expiration date, whichever comes first. Do not store your pen with the needle attached. If the needle is left on, medicine may leak from the pen. NOTE: This sheet is a summary. It may not cover all possible information. If you have questions about this medicine, talk to your doctor, pharmacist, or health care provider.  2020 Elsevier/Gold Standard (2019-02-01 21:16:59)

## 2019-10-29 NOTE — Progress Notes (Signed)
Subjective:  Patient ID: Debbie Stark, female    DOB: [birth year]  Age: 54 y.o. MRN: [id number]  Chief Complaint  Patient presents with  . Hypertension  . Hyperlipidemia    HPI  Recent COVID testing -negative 6/24/-televisit-pressure in sinuses-medication given, currently taking claritin Visual changes -pressure noted with big flux in blood pressure-yesterday bp after church-147/69 12:30pm then 130/72 1:03pm, 124/66 3:40, 119/67, 4:50-high headed noted with flux of blood pressure. Pt went off the second medication for 1 week-pt states less headache and pressure sensation. Taking zestril and Calan-SR Concern for GI -Abdominal Pain 2 weeks ago lasting 24hour, Nausea-Bloating. Pt wondering if symptoms related to her taking Saxenda. Pt states Tummy Tuck in the past-now with weight gain. Pt took Phentermine in the past for weight loss.  Pt states she can not associate abdominal pain with eating or rest HTN-takes zestril  And Calan daily-no concerns Cologuard -2019 Current Outpatient Medications on File Prior to Visit  Medication Sig Dispense Refill  . SAXENDA 18 MG/3ML SOPN Inject 0.5 mLs (3 mg total) into the skin daily. (Patient taking differently: Inject 1.2 mg into the skin 2 (two) times daily. ) 5 pen 2  . atorvastatin (LIPITOR) 10 MG tablet TAKE 1 TABLET(10 MG) BY MOUTH DAILY 90 tablet 0  . cyclobenzaprine (FLEXERIL) 10 MG tablet Take 10 mg by mouth 3 (three) times daily as needed.    . famotidine (PEPCID) 20 MG tablet Take 1 tablet (20 mg total) by mouth 2 (two) times daily. 180 tablet 0  . levonorgestrel (MIRENA) 20 MCG/24HR IUD by Intrauterine route.    Marland Kitchen lisinopril (ZESTRIL) 10 MG tablet TAKE 1 TABLET(10 MG) BY MOUTH DAILY 90 tablet 0  . meloxicam (MOBIC) 15 MG tablet TAKE 1 TABLET BY MOUTH EVERY DAY 90 tablet 0  . promethazine (PHENERGAN) 25 MG tablet Take by mouth.    . rizatriptan (MAXALT) 10 MG tablet Take by mouth.    . verapamil (CALAN-SR) 180 MG CR tablet TAKE 1 TABLET(180  MG) BY MOUTH DAILY 90 tablet 0  . zolpidem (AMBIEN CR) 6.25 MG CR tablet Take 6.25 mg by mouth at bedtime as needed for sleep.     No current facility-administered medications on file prior to visit.   Past Medical History:  Diagnosis Date  . Anxiety   . GERD (gastroesophageal reflux disease)   . Hypertension   . IBS (irritable bowel syndrome)    with constipation  . Low back pain   . Migraine with aura, intractable, without status migrainosus    No past surgical history on file.  Family History  Problem Relation Age of Onset  . Cancer Mother        endometrial  . Diabetes Father   . Hyperlipidemia Father   . Hypertension Father   . Heart disease Father   . Stroke Father    Social History   Socioeconomic History  . Marital status: Unknown    Spouse name: Not on file  . Number of children: Not on file  . Years of education: Not on file  . Highest education level: Not on file  Occupational History  . Not on file  Tobacco Use  . Smoking status: Never Smoker  . Smokeless tobacco: Never Used  Substance and Sexual Activity  . Alcohol use: Yes    Comment: drinks 1-2 x per month  . Drug use: Never  . Sexual activity: Not on file  Other Topics Concern  . Not on file  Social History  Narrative  . Not on file   Social Determinants of Health   Financial Resource Strain:   . Difficulty of Paying Living Expenses:   Food Insecurity:   . Worried About Programme researcher, broadcasting/film/video in the Last Year:   . Barista in the Last Year:   Transportation Needs:   . Freight forwarder (Medical):   Marland Kitchen Lack of Transportation (Non-Medical):   Physical Activity:   . Days of Exercise per Week:   . Minutes of Exercise per Session:   Stress:   . Feeling of Stress :   Social Connections:   . Frequency of Communication with Friends and Family:   . Frequency of Social Gatherings with Friends and Family:   . Attends Religious Services:   . Active Member of Clubs or Organizations:   .  Attends Banker Meetings:   Marland Kitchen Marital Status:     Review of Systems  Constitutional: Positive for appetite change. Negative for chills, fatigue and fever.  HENT: Negative for congestion, ear pain and sore throat.   Respiratory: Negative for cough and shortness of breath.   Cardiovascular: Negative for chest pain and palpitations.  Gastrointestinal: Positive for abdominal pain, nausea and vomiting. Negative for constipation and diarrhea.  Genitourinary: Negative for dysuria and urgency.  Musculoskeletal: Positive for back pain (osteoarthritis).  Neurological: Positive for headaches. Negative for dizziness.  Psychiatric/Behavioral: Negative for dysphoric mood. The patient is not nervous/anxious.      Objective:  BP 128/74   Pulse 78   Temp 97.6 F (36.4 C)   Resp 18   Ht 5\' 5"  (1.651 m)   Wt 217 lb 12.8 oz (98.8 kg)   BMI 36.24 kg/m   BP/Weight 10/29/2019 07/25/2019 05/21/2019  Systolic BP 128 130 -  Diastolic BP 74 80 -  Wt. (Lbs) 217.8 206 205  BMI 36.24 34.28 -    Physical Exam HENT:     Head: Normocephalic and atraumatic.  Cardiovascular:     Rate and Rhythm: Normal rate and regular rhythm.     Pulses: Normal pulses.     Heart sounds: Normal heart sounds.  Pulmonary:     Effort: Pulmonary effort is normal.     Breath sounds: Normal breath sounds.  Abdominal:     Palpations: Abdomen is soft.  Musculoskeletal:        General: Normal range of motion.     Cervical back: Normal range of motion and neck supple.     Right lower leg: No edema.     Left lower leg: No edema.  Neurological:     General: No focal deficit present.     Mental Status: She is alert and oriented to person, place, and time.  Psychiatric:        Mood and Affect: Mood normal.        Behavior: Behavior normal.     Lab Results  Component Value Date   WBC 6.6 07/25/2019   HGB 13.6 07/25/2019   HCT 41.5 07/25/2019   PLT 215 07/25/2019   GLUCOSE 86 07/25/2019   CHOL 131  07/25/2019   TRIG 52 07/25/2019   HDL 47 07/25/2019   LDLCALC 72 07/25/2019   ALT 8 07/25/2019   AST 11 07/25/2019   NA 140 07/25/2019   K 4.8 07/25/2019   CL 103 07/25/2019   CREATININE 1.03 (H) 07/25/2019   BUN 16 07/25/2019   CO2 22 07/25/2019      Assessment & Plan:  1. Generalized abdominal pain Concern for possible gallbladder concerns-noted nausea and vomiting - US Abdomen Limited RUQ; Future-pt declines currently - Amylase GERD in the past-increase dose to omeprazole to 40mg -rx Calendar to write abdominal pain and associated symptoms 2. Essential hypertension lisinpril- and  Calan daily-stable - CBC - Comprehensive metabolic panel  3. Mixed hyperlipidemia Lipitor -stable - Lipid panel - TSH   40 minutes discussing history, medications, weight loss, ulcers,  Follow-up:  An After Visit Summary was printed and given to the patient.  [facility] [phone]

## 2019-10-30 LAB — CBC
Hematocrit: 37.8 % (ref 34.0–46.6)
Hemoglobin: 12.5 g/dL (ref 11.1–15.9)
MCH: 32 pg (ref 26.6–33.0)
MCHC: 33.1 g/dL (ref 31.5–35.7)
MCV: 97 fL (ref 79–97)
Platelets: 190 10*3/uL (ref 150–450)
RBC: 3.91 x10E6/uL (ref 3.77–5.28)
RDW: 12.1 % (ref 11.7–15.4)
WBC: 6.4 10*3/uL (ref 3.4–10.8)

## 2019-10-30 LAB — AMYLASE: Amylase: 57 U/L (ref 31–110)

## 2019-10-30 LAB — TSH: TSH: 2.85 u[IU]/mL (ref 0.450–4.500)

## 2019-10-30 LAB — LIPID PANEL
Chol/HDL Ratio: 3.1 ratio (ref 0.0–4.4)
Cholesterol, Total: 159 mg/dL (ref 100–199)
HDL: 51 mg/dL (ref >39)
LDL Chol Calc (NIH): 94 mg/dL (ref 0–99)
Triglycerides: 74 mg/dL (ref 0–149)
VLDL Cholesterol Cal: 14 mg/dL (ref 5–40)

## 2019-10-30 LAB — COMPREHENSIVE METABOLIC PANEL
ALT: 13 IU/L (ref 0–32)
AST: 18 IU/L (ref 0–40)
Albumin/Globulin Ratio: 1.6 (ref 1.2–2.2)
Albumin: 3.9 g/dL (ref 3.8–4.9)
Alkaline Phosphatase: 70 IU/L (ref 48–121)
BUN/Creatinine Ratio: 13 (ref 9–23)
BUN: 13 mg/dL (ref 6–24)
Bilirubin Total: 0.5 mg/dL (ref 0.0–1.2)
CO2: 25 mmol/L (ref 20–29)
Calcium: 8.8 mg/dL (ref 8.7–10.2)
Chloride: 102 mmol/L (ref 96–106)
Creatinine, Ser: 0.99 mg/dL (ref 0.57–1.00)
GFR calc Af Amer: 76 mL/min/{1.73_m2} (ref >59)
GFR calc non Af Amer: 66 mL/min/{1.73_m2} (ref >59)
Globulin, Total: 2.4 g/dL (ref 1.5–4.5)
Glucose: 80 mg/dL (ref 65–99)
Potassium: 4.8 mmol/L (ref 3.5–5.2)
Sodium: 138 mmol/L (ref 134–144)
Total Protein: 6.3 g/dL (ref 6.0–8.5)

## 2019-10-30 LAB — CARDIOVASCULAR RISK ASSESSMENT

## 2019-11-27 ENCOUNTER — Other Ambulatory Visit: Payer: Self-pay | Admitting: Family Medicine

## 2019-12-12 ENCOUNTER — Other Ambulatory Visit: Payer: Self-pay | Admitting: Physician Assistant

## 2019-12-31 ENCOUNTER — Other Ambulatory Visit: Payer: Self-pay | Admitting: Family Medicine

## 2020-01-01 ENCOUNTER — Other Ambulatory Visit: Payer: Self-pay

## 2020-01-02 NOTE — Telephone Encounter (Signed)
Please call pt and see how she is taking it  I actually thought she was taking 1.2 in am and 0.6 in evening to decrease nausea. Kc

## 2020-01-07 ENCOUNTER — Other Ambulatory Visit: Payer: Self-pay | Admitting: Physician Assistant

## 2020-01-07 NOTE — Telephone Encounter (Signed)
Left message to call our office back.

## 2020-01-08 ENCOUNTER — Other Ambulatory Visit: Payer: Self-pay | Admitting: Family Medicine

## 2020-01-08 MED ORDER — SAXENDA 18 MG/3ML ~~LOC~~ SOPN: 1.2000 mg | PEN_INJECTOR | Freq: Every day | SUBCUTANEOUS | 0 refills | Status: DC

## 2020-01-08 MED ORDER — SAXENDA 18 MG/3ML ~~LOC~~ SOPN: 2.4000 mg | PEN_INJECTOR | SUBCUTANEOUS | 0 refills | Status: DC

## 2020-01-08 NOTE — Telephone Encounter (Signed)
New rx sent. Pt is taking 2.4 mg once weekly.

## 2020-01-08 NOTE — Telephone Encounter (Signed)
Patient called back and stated that she is taking the Saxenda only once daily in the morning and doing 2.4 and not doing the night time injection. So patient will need a new script sent to the pharmacy so she won't run out of the saxenda! [place]

## 2020-02-04 ENCOUNTER — Ambulatory Visit: Payer: BC Managed Care – PPO | Admitting: Family Medicine

## 2020-02-04 ENCOUNTER — Encounter: Payer: Self-pay | Admitting: Family Medicine

## 2020-02-04 ENCOUNTER — Other Ambulatory Visit: Payer: Self-pay

## 2020-02-04 VITALS — BP 136/88 | HR 78 | Temp 97.3°F | Resp 18 | Ht 65.0 in | Wt 217.2 lb

## 2020-02-04 DIAGNOSIS — G43109 Migraine with aura, not intractable, without status migrainosus: Secondary | ICD-10-CM | POA: Diagnosis not present

## 2020-02-04 DIAGNOSIS — M542 Cervicalgia: Secondary | ICD-10-CM

## 2020-02-04 DIAGNOSIS — Z6836 Body mass index (BMI) 36.0-36.9, adult: Secondary | ICD-10-CM

## 2020-02-04 DIAGNOSIS — E782 Mixed hyperlipidemia: Secondary | ICD-10-CM

## 2020-02-04 DIAGNOSIS — K580 Irritable bowel syndrome with diarrhea: Secondary | ICD-10-CM

## 2020-02-04 DIAGNOSIS — K219 Gastro-esophageal reflux disease without esophagitis: Secondary | ICD-10-CM

## 2020-02-04 DIAGNOSIS — Z23 Encounter for immunization: Secondary | ICD-10-CM

## 2020-02-04 DIAGNOSIS — I10 Essential (primary) hypertension: Secondary | ICD-10-CM | POA: Diagnosis not present

## 2020-02-04 MED ORDER — OMEPRAZOLE 40 MG PO CPDR: 40.0000 mg | DELAYED_RELEASE_CAPSULE | Freq: Every day | ORAL | 3 refills | Status: DC

## 2020-02-04 MED ORDER — MELOXICAM 15 MG PO TABS: 15.0000 mg | ORAL_TABLET | Freq: Every day | ORAL | 3 refills | Status: DC

## 2020-02-04 MED ORDER — VERAPAMIL HCL ER 180 MG PO TBCR: EXTENDED_RELEASE_TABLET | ORAL | 1 refills | Status: DC

## 2020-02-04 MED ORDER — RIZATRIPTAN BENZOATE 10 MG PO TABS: ORAL_TABLET | ORAL | 1 refills | Status: DC

## 2020-02-04 MED ORDER — FAMOTIDINE 20 MG PO TABS: ORAL_TABLET | ORAL | 3 refills | Status: DC

## 2020-02-04 MED ORDER — SAXENDA 18 MG/3ML ~~LOC~~ SOPN: 2.4000 mg | PEN_INJECTOR | Freq: Every day | SUBCUTANEOUS | 3 refills | Status: DC

## 2020-02-04 NOTE — Patient Instructions (Signed)
Take maxalt earlier.  Recommend continue to work on eating healthy diet and exercise.  DASH Eating Plan DASH stands for "Dietary Approaches to Stop Hypertension." The DASH eating plan is a healthy eating plan that has been shown to reduce high blood pressure (hypertension). It may also reduce your risk for type 2 diabetes, heart disease, and stroke. The DASH eating plan may also help with weight loss. What are tips for following this plan?  General guidelines  Avoid eating more than 2,300 mg (milligrams) of salt (sodium) a day. If you have hypertension, you may need to reduce your sodium intake to 1,500 mg a day.  Limit alcohol intake to no more than 1 drink a day for nonpregnant women and 2 drinks a day for men. One drink equals 12 oz of beer, 5 oz of wine, or 1 oz of hard liquor.  Work with your health care provider to maintain a healthy body weight or to lose weight. Ask what an ideal weight is for you.  Get at least 30 minutes of exercise that causes your heart to beat faster (aerobic exercise) most days of the week. Activities may include walking, swimming, or biking.  Work with your health care provider or diet and nutrition specialist (dietitian) to adjust your eating plan to your individual calorie needs. Reading food labels   Check food labels for the amount of sodium per serving. Choose foods with less than 5 percent of the Daily Value of sodium. Generally, foods with less than 300 mg of sodium per serving fit into this eating plan.  To find whole grains, look for the word "whole" as the first word in the ingredient list. Shopping  Buy products labeled as "low-sodium" or "no salt added."  Buy fresh foods. Avoid canned foods and premade or frozen meals. Cooking  Avoid adding salt when cooking. Use salt-free seasonings or herbs instead of table salt or sea salt. Check with your health care provider or pharmacist before using salt substitutes.  Do not fry foods. Cook foods  using healthy methods such as baking, boiling, grilling, and broiling instead.  Cook with heart-healthy oils, such as olive, canola, soybean, or sunflower oil. Meal planning  Eat a balanced diet that includes: ? 5 or more servings of fruits and vegetables each day. At each meal, try to fill half of your plate with fruits and vegetables. ? Up to 6-8 servings of whole grains each day. ? Less than 6 oz of lean meat, poultry, or fish each day. A 3-oz serving of meat is about the same size as a deck of cards. One egg equals 1 oz. ? 2 servings of low-fat dairy each day. ? A serving of nuts, seeds, or beans 5 times each week. ? Heart-healthy fats. Healthy fats called Omega-3 fatty acids are found in foods such as flaxseeds and coldwater fish, like sardines, salmon, and mackerel.  Limit how much you eat of the following: ? Canned or prepackaged foods. ? Food that is high in trans fat, such as fried foods. ? Food that is high in saturated fat, such as fatty meat. ? Sweets, desserts, sugary drinks, and other foods with added sugar. ? Full-fat dairy products.  Do not salt foods before eating.  Try to eat at least 2 vegetarian meals each week.  Eat more home-cooked food and less restaurant, buffet, and fast food.  When eating at a restaurant, ask that your food be prepared with less salt or no salt, if possible. What foods are  recommended? The items listed may not be a complete list. Talk with your dietitian about what dietary choices are best for you. Grains Whole-grain or whole-wheat bread. Whole-grain or whole-wheat pasta. Brown rice. [place]. Bulgur. Whole-grain and low-sodium cereals. Pita bread. Low-fat, low-sodium crackers. Whole-wheat flour tortillas. Vegetables Fresh or frozen vegetables (raw, steamed, roasted, or grilled). Low-sodium or reduced-sodium tomato and vegetable juice. Low-sodium or reduced-sodium tomato sauce and tomato paste. Low-sodium or reduced-sodium canned  vegetables. Fruits All fresh, dried, or frozen fruit. Canned fruit in natural juice (without added sugar). Meat and other protein foods Skinless chicken or [place]. Ground chicken or [place]. Pork with fat trimmed off. Fish and seafood. Egg whites. Dried beans, peas, or lentils. Unsalted nuts, nut butters, and seeds. Unsalted canned beans. Lean cuts of beef with fat trimmed off. Low-sodium, lean deli meat. Dairy Low-fat (1%) or fat-free (skim) milk. Fat-free, low-fat, or reduced-fat cheeses. Nonfat, low-sodium ricotta or cottage cheese. Low-fat or nonfat yogurt. Low-fat, low-sodium cheese. Fats and oils Soft margarine without trans fats. Vegetable oil. Low-fat, reduced-fat, or light mayonnaise and salad dressings (reduced-sodium). Canola, safflower, olive, soybean, and sunflower oils. Avocado. Seasoning and other foods Herbs. Spices. Seasoning mixes without salt. Unsalted popcorn and pretzels. Fat-free sweets. What foods are not recommended? The items listed may not be a complete list. Talk with your dietitian about what dietary choices are best for you. Grains Baked goods made with fat, such as croissants, muffins, or some breads. Dry pasta or rice meal packs. Vegetables Creamed or fried vegetables. Vegetables in a cheese sauce. Regular canned vegetables (not low-sodium or reduced-sodium). Regular canned tomato sauce and paste (not low-sodium or reduced-sodium). Regular tomato and vegetable juice (not low-sodium or reduced-sodium). Angie Fava. Olives. Fruits Canned fruit in a light or heavy syrup. Fried fruit. Fruit in cream or butter sauce. Meat and other protein foods Fatty cuts of meat. Ribs. Fried meat. Berniece Salines. Sausage. Bologna and other processed lunch meats. Salami. Fatback. Hotdogs. Bratwurst. Salted nuts and seeds. Canned beans with added salt. Canned or smoked fish. Whole eggs or egg yolks. Chicken or [place] with skin. Dairy Whole or 2% milk, cream, and half-and-half. Whole or full-fat  cream cheese. Whole-fat or sweetened yogurt. Full-fat cheese. Nondairy creamers. Whipped toppings. Processed cheese and cheese spreads. Fats and oils Butter. Stick margarine. Lard. Shortening. Ghee. Bacon fat. Tropical oils, such as coconut, palm kernel, or palm oil. Seasoning and other foods Salted popcorn and pretzels. Onion salt, garlic salt, seasoned salt, table salt, and sea salt. Worcestershire sauce. Tartar sauce. Barbecue sauce. Teriyaki sauce. Soy sauce, including reduced-sodium. Steak sauce. Canned and packaged gravies. Fish sauce. Oyster sauce. Cocktail sauce. Horseradish that you find on the shelf. Ketchup. Mustard. Meat flavorings and tenderizers. Bouillon cubes. Hot sauce and Tabasco sauce. Premade or packaged marinades. Premade or packaged taco seasonings. Relishes. Regular salad dressings. Where to find more information:  National Heart, Lung, and [place]: https://wilson-eaton.com/  American Heart Association: www.heart.org Summary  The DASH eating plan is a healthy eating plan that has been shown to reduce high blood pressure (hypertension). It may also reduce your risk for type 2 diabetes, heart disease, and stroke.  With the DASH eating plan, you should limit salt (sodium) intake to 2,300 mg a day. If you have hypertension, you may need to reduce your sodium intake to 1,500 mg a day.  When on the DASH eating plan, aim to eat more fresh fruits and vegetables, whole grains, lean proteins, low-fat dairy, and heart-healthy fats.  Work with your health  care provider or diet and nutrition specialist (dietitian) to adjust your eating plan to your individual calorie needs. This information is not intended to replace advice given to you by your health care provider. Make sure you discuss any questions you have with your health care provider. Document Revised: 03/11/2017 Document Reviewed: 03/22/2016 Elsevier Patient Education  2020 [employer].  Migraine Headache A migraine  headache is a very strong throbbing pain on one side or both sides of your head. This type of headache can also cause other symptoms. It can last from 4 hours to 3 days. Talk with your doctor about what things may bring on (trigger) this condition. What are the causes? The exact cause of this condition is not known. This condition may be triggered or caused by:  Drinking alcohol.  Smoking.  Taking medicines, such as: ? Medicine used to treat chest pain (nitroglycerin). ? Birth control pills. ? Estrogen. ? Some blood pressure medicines.  Eating or drinking certain products.  Doing physical activity. Other things that may trigger a migraine headache include:  Having a menstrual period.  Pregnancy.  Hunger.  Stress.  Not getting enough sleep or getting too much sleep.  Weather changes.  Tiredness (fatigue). What increases the risk?  Being 74-31 years old.  Being female.  Having a family history of migraine headaches.  Being Caucasian.  Having depression or anxiety.  Being very overweight. What are the signs or symptoms?  A throbbing pain. This pain may: ? Happen in any area of the head, such as on one side or both sides. ? Make it hard to do daily activities. ? Get worse with physical activity. ? Get worse around bright lights or loud noises.  Other symptoms may include: ? Feeling sick to your stomach (nauseous). ? Vomiting. ? Dizziness. ? Being sensitive to bright lights, loud noises, or smells.  Before you get a migraine headache, you may get warning signs (an aura). An aura may include: ? Seeing flashing lights or having blind spots. ? Seeing bright spots, halos, or zigzag lines. ? Having tunnel vision or blurred vision. ? Having numbness or a tingling feeling. ? Having trouble talking. ? Having weak muscles.  Some people have symptoms after a migraine headache (postdromal phase), such as: ? Tiredness. ? Trouble thinking (concentrating). How is  this treated?  Taking medicines that: ? Relieve pain. ? Relieve the feeling of being sick to your stomach. ? Prevent migraine headaches.  Treatment may also include: ? Having acupuncture. ? Avoiding foods that bring on migraine headaches. ? Learning ways to control your body functions (biofeedback). ? Therapy to help you know and deal with negative thoughts (cognitive behavioral therapy). Follow these instructions at home: Medicines  Take over-the-counter and prescription medicines only as told by your doctor.  Ask your doctor if the medicine prescribed to you: ? Requires you to avoid driving or using heavy machinery. ? Can cause trouble pooping (constipation). You may need to take these steps to prevent or treat trouble pooping:  Drink enough fluid to keep your pee (urine) pale yellow.  Take over-the-counter or prescription medicines.  Eat foods that are high in fiber. These include beans, whole grains, and fresh fruits and vegetables.  Limit foods that are high in fat and sugar. These include fried or sweet foods. Lifestyle  Do not drink alcohol.  Do not use any products that contain nicotine or tobacco, such as cigarettes, e-cigarettes, and chewing tobacco. If you need help quitting, ask your doctor.  Get at least 8 hours of sleep every night.  Limit and deal with stress. General instructions      Keep a journal to find out what may bring on your migraine headaches. For example, write down: ? What you eat and drink. ? How much sleep you get. ? Any change in what you eat or drink. ? Any change in your medicines.  If you have a migraine headache: ? Avoid things that make your symptoms worse, such as bright lights. ? It may help to lie down in a dark, quiet room. ? Do not drive or use heavy machinery. ? Ask your doctor what activities are safe for you.  Keep all follow-up visits as told by your doctor. This is important. Contact a doctor if:  You get a  migraine headache that is different or worse than others you have had.  You have more than 15 headache days in one month. Get help right away if:  Your migraine headache gets very bad.  Your migraine headache lasts longer than 72 hours.  You have a fever.  You have a stiff neck.  You have trouble seeing.  Your muscles feel weak or like you cannot control them.  You start to lose your balance a lot.  You start to have trouble walking.  You pass out (faint).  You have a seizure. Summary  A migraine headache is a very strong throbbing pain on one side or both sides of your head. These headaches can also cause other symptoms.  This condition may be treated with medicines and changes to your lifestyle.  Keep a journal to find out what may bring on your migraine headaches.  Contact a doctor if you get a migraine headache that is different or worse than others you have had.  Contact your doctor if you have more than 15 headache days in a month. This information is not intended to replace advice given to you by your health care provider. Make sure you discuss any questions you have with your health care provider. Document Revised: 07/21/2018 Document Reviewed: 05/11/2018 Elsevier Patient Education  2020 [employer].

## 2020-02-04 NOTE — Progress Notes (Signed)
Established Patient Office Visit  Subjective:  Patient ID: Debbie Stark, female    DOB: 03-Jul-1965  Age: 54 y.o. MRN: [id number]  CC:  Chief Complaint  Patient presents with  . Hypertension    HPI Pt presents for follow up of hypertension.  Date of diagnosis 05/2014.  Current nonpharmacologic treatment includes low sodium diet.  Her current cardiac medication regimen includes an ACE inhibitor ( Lisinopril 10 mg QD ) and a calcium channel blocker ( Calan SR 180 mg QD ).  Compliance with treatment has been good; she takes her medication as directed, maintains her diet and exercise regimen, and follows up as directed.  Concurrent health problems include hyperlipidemia.      Patient presents with morbid obesity Debbie Stark is currently taking Saxenda injections daily for weight loss management.  Started at 1.2 and then has increased to 2.4. Unable to go to 3 mg in the past due to diarrhea side effect. Patient has fallen off diet. She ran out of saxenda. Restarted them and have lost 5 lbs in one week. Helps suppress her appetite. Not exercising.      In regard to the migraine with aura, not intractable, without status migrainosus, Debbie Stark was diagnosed with migraine headaches several years ago.  She has had prior headaches similar to this one. Typical migraine frequency 1-3 headaches in a week.    Takes ibuprofen.  Prophylaxis with calan sr. She is taking rizatriptan, but is concerned is expired.   Compliance with treatment has been good; she takes her medication as directed.    Pertinent past medical history includes migraines and hypertension.      Pt presents with hyperlipidemia.  Date of diagnosis 11/2011.  Current treatment includes Lipitor 10 mg once daily.  Not exercising or following a low cholesterol/low fat diet.      IBS - amitiza was nonformulary. Pt was taking linzess but was too strong or else was the saxenda. Resolved with lower dose of saxenda.   GERD: On omeprazole 40 mg daily and  pepcid 20 mg one twice a day.   Neck pain/back pain intermittently. Use meloxicam daily and flexeril prn. This is working.   Past Medical History:  Diagnosis Date  . Anxiety   . GERD (gastroesophageal reflux disease)   . Hypertension   . IBS (irritable bowel syndrome)    with constipation  . Low back pain   . Migraine with aura, intractable, without status migrainosus     History reviewed. No pertinent surgical history.  Family History  Problem Relation Age of Onset  . Cancer Mother        endometrial  . Diabetes Father   . Hyperlipidemia Father   . Hypertension Father   . Heart disease Father   . Stroke Father     Social History   Socioeconomic History  . Marital status: Unknown    Spouse name: Not on file  . Number of children: Not on file  . Years of education: Not on file  . Highest education level: Not on file  Occupational History  . Not on file  Tobacco Use  . Smoking status: Never Smoker  . Smokeless tobacco: Never Used  Substance and Sexual Activity  . Alcohol use: Yes    Comment: drinks 1-2 x per month  . Drug use: Never  . Sexual activity: Not on file  Other Topics Concern  . Not on file  Social History Narrative  . Not on file   Social Determinants  of Health   Financial Resource Strain:   . Difficulty of Paying Living Expenses: Not on file  Food Insecurity:   . Worried About Programme researcher, broadcasting/film/video in the Last Year: Not on file  . Ran Out of Food in the Last Year: Not on file  Transportation Needs:   . Lack of Transportation (Medical): Not on file  . Lack of Transportation (Non-Medical): Not on file  Physical Activity:   . Days of Exercise per Week: Not on file  . Minutes of Exercise per Session: Not on file  Stress:   . Feeling of Stress : Not on file  Social Connections:   . Frequency of Communication with Friends and Family: Not on file  . Frequency of Social Gatherings with Friends and Family: Not on file  . Attends Religious Services:  Not on file  . Active Member of Clubs or Organizations: Not on file  . Attends Banker Meetings: Not on file  . Marital Status: Not on file  Intimate Partner Violence:   . Fear of Current or Ex-Partner: Not on file  . Emotionally Abused: Not on file  . Physically Abused: Not on file  . Sexually Abused: Not on file    Outpatient Medications Prior to Visit  Medication Sig Dispense Refill  . atorvastatin (LIPITOR) 10 MG tablet TAKE 1 TABLET(10 MG) BY MOUTH DAILY 90 tablet 0  . cyclobenzaprine (FLEXERIL) 10 MG tablet Take 10 mg by mouth 3 (three) times daily as needed.    . Liraglutide -Weight Management (SAXENDA) 18 MG/3ML SOPN Inject 2.4 mg into the skin once a week. 15 mL 0  . lisinopril (ZESTRIL) 10 MG tablet TAKE 1 TABLET(10 MG) BY MOUTH DAILY 90 tablet 0  . promethazine (PHENERGAN) 25 MG tablet Take by mouth.    . zolpidem (AMBIEN CR) 6.25 MG CR tablet Take 6.25 mg by mouth at bedtime as needed for sleep.    . famotidine (PEPCID) 20 MG tablet TAKE 1 TABLET(20 MG) BY MOUTH TWICE DAILY 180 tablet 0  . levonorgestrel (MIRENA) 20 MCG/24HR IUD by Intrauterine route.    . meloxicam (MOBIC) 15 MG tablet TAKE 1 TABLET BY MOUTH EVERY DAY 90 tablet 0  . omeprazole (PRILOSEC) 40 MG capsule Take 1 capsule (40 mg total) by mouth daily. 30 capsule 3  . rizatriptan (MAXALT) 10 MG tablet Take by mouth.    Marland Kitchen SAXENDA 18 MG/3ML SOPN Inject 1.2 mg into the skin daily. 15 mL 0  . verapamil (CALAN-SR) 180 MG CR tablet TAKE 1 TABLET(180 MG) BY MOUTH DAILY 90 tablet 0   No facility-administered medications prior to visit.    Allergies  Allergen Reactions  . Sulfa Antibiotics     ROS Review of Systems  Constitutional: Negative for chills, diaphoresis, fatigue and fever.  HENT: Negative for congestion, ear pain, rhinorrhea and sore throat.   Respiratory: Negative for cough and shortness of breath.   Cardiovascular: Negative for chest pain.  Gastrointestinal: Positive for abdominal pain  (sometimes), diarrhea (sometimes. ) and nausea (sometiems). Negative for vomiting.  Genitourinary: Negative for dysuria and urgency.  Musculoskeletal: Negative for arthralgias, back pain and myalgias.  Neurological: Positive for headaches. Dizziness: Sometimes.  Psychiatric/Behavioral: Negative for dysphoric mood. The patient is not nervous/anxious.       Objective:    Physical Exam Constitutional:      Appearance: Normal appearance. She is well-developed. She is obese.  Neck:     Vascular: No carotid bruit.  Cardiovascular:  Rate and Rhythm: Normal rate and regular rhythm.     Heart sounds: Normal heart sounds.  Pulmonary:     Effort: Pulmonary effort is normal.     Breath sounds: Normal breath sounds.  Abdominal:     General: Bowel sounds are normal.     Palpations: Abdomen is soft.     Tenderness: There is no abdominal tenderness.  Musculoskeletal:     Comments: .   Neurological:     Mental Status: She is alert and oriented to person, place, and time.  Psychiatric:        Mood and Affect: Mood normal.        Behavior: Behavior normal.     BP 136/88   Pulse 78   Temp (!) 97.3 F (36.3 C)   Resp 18   Ht 5\' 5"  (1.651 m)   Wt 217 lb 3.2 oz (98.5 kg)   BMI 36.14 kg/m  Wt Readings from Last 3 Encounters:  02/04/20 217 lb 3.2 oz (98.5 kg)  10/29/19 217 lb 12.8 oz (98.8 kg)  07/25/19 206 lb (93.4 kg)     Health Maintenance Due  Topic Date Due  . Hepatitis C Screening  Never done  . COVID-19 Vaccine (1) Never done  . HIV Screening  Never done  . TETANUS/TDAP  Never done  . COLONOSCOPY  Never done    There are no preventive care reminders to display for this patient.  Lab Results  Component Value Date   TSH 2.850 10/29/2019   Lab Results  Component Value Date   WBC 6.4 10/29/2019   HGB 12.5 10/29/2019   HCT 37.8 10/29/2019   MCV 97 10/29/2019   PLT 190 10/29/2019   Lab Results  Component Value Date   NA 138 10/29/2019   K 4.8 10/29/2019   CO2  25 10/29/2019   GLUCOSE 80 10/29/2019   BUN 13 10/29/2019   CREATININE 0.99 10/29/2019   BILITOT 0.5 10/29/2019   ALKPHOS 70 10/29/2019   AST 18 10/29/2019   ALT 13 10/29/2019   PROT 6.3 10/29/2019   ALBUMIN 3.9 10/29/2019   CALCIUM 8.8 10/29/2019   Lab Results  Component Value Date   CHOL 159 10/29/2019   Lab Results  Component Value Date   HDL 51 10/29/2019   Lab Results  Component Value Date   LDLCALC 94 10/29/2019   Lab Results  Component Value Date   TRIG 74 10/29/2019   Lab Results  Component Value Date   CHOLHDL 3.1 10/29/2019   No results found for: HGBA1C    Assessment & Plan:  1. Essential hypertension Well controlled.  No changes to medicines.  Continue to work on eating a healthy diet and exercise.  Labs drawn today.  - CBC with Differential/Platelet - Comprehensive metabolic panel - verapamil (CALAN-SR) 180 MG CR tablet; TAKE 1 TABLET(180 MG) BY MOUTH DAILY  Dispense: 90 tablet; Refill: 1  2. Mixed hyperlipidemia Well controlled.  No changes to medicines.  Continue to work on eating a healthy diet and exercise.  Labs reviewed from recent life insurance check.    3. Irritable bowel syndrome with diarrhea Improved.   4. Migraine with aura and without status migrainosus, not intractable Well controlled.  No changes to medicines.  Encouraged to take maxalt earlier.  - rizatriptan (MAXALT) 10 MG tablet; Take one at onset of migraine/aura, repeat in 2 hours if not resolved. NO more than 2 in 24 hours.  Dispense: 90 tablet; Refill: 1  5.  Encounter for immunization - Flu Vaccine MDCK QUAD PF  6. Morbid obesity (HCC) Dash diet.  Recommend exercise.  - SAXENDA 18 MG/3ML SOPN; Inject 2.4 mg into the skin daily.  Dispense: 18 mL; Refill: 3  7. BMI 36.0-36.9,adult See above  8. Gastroesophageal reflux disease without esophagitis The current medical regimen is effective;  continue present plan and medications. - omeprazole (PRILOSEC) 40 MG  capsule; Take 1 capsule (40 mg total) by mouth daily.  Dispense: 90 capsule; Refill: 3 - famotidine (PEPCID) 20 MG tablet; TAKE 1 TABLET(20 MG) BY MOUTH TWICE DAILY  Dispense: 180 tablet; Refill: 3  9. Neck pain The current medical regimen is effective;  continue present plan and medications. - meloxicam (MOBIC) 15 MG tablet; Take 1 tablet (15 mg total) by mouth daily.  Dispense: 90 tablet; Refill: 3  Follow-up: Return in about 4 months (around 06/06/2020).    Blane Ohara, MD

## 2020-02-05 LAB — CBC WITH DIFFERENTIAL/PLATELET
Basophils Absolute: 0 10*3/uL (ref 0.0–0.2)
Basos: 1 %
EOS (ABSOLUTE): 0.2 10*3/uL (ref 0.0–0.4)
Eos: 3 %
Hematocrit: 39.5 % (ref 34.0–46.6)
Hemoglobin: 13.4 g/dL (ref 11.1–15.9)
Immature Grans (Abs): 0 10*3/uL (ref 0.0–0.1)
Immature Granulocytes: 0 %
Lymphocytes Absolute: 1.5 10*3/uL (ref 0.7–3.1)
Lymphs: 26 %
MCH: 31.9 pg (ref 26.6–33.0)
MCHC: 33.9 g/dL (ref 31.5–35.7)
MCV: 94 fL (ref 79–97)
Monocytes Absolute: 0.3 10*3/uL (ref 0.1–0.9)
Monocytes: 6 %
Neutrophils Absolute: 3.8 10*3/uL (ref 1.4–7.0)
Neutrophils: 64 %
Platelets: 221 10*3/uL (ref 150–450)
RBC: 4.2 x10E6/uL (ref 3.77–5.28)
RDW: 11.9 % (ref 11.7–15.4)
WBC: 5.8 10*3/uL (ref 3.4–10.8)

## 2020-02-05 LAB — COMPREHENSIVE METABOLIC PANEL
ALT: 14 IU/L (ref 0–32)
AST: 21 IU/L (ref 0–40)
Albumin/Globulin Ratio: 1.8 (ref 1.2–2.2)
Albumin: 4.4 g/dL (ref 3.8–4.9)
Alkaline Phosphatase: 70 IU/L (ref 44–121)
BUN/Creatinine Ratio: 17 (ref 9–23)
BUN: 19 mg/dL (ref 6–24)
Bilirubin Total: 0.7 mg/dL (ref 0.0–1.2)
CO2: 23 mmol/L (ref 20–29)
Calcium: 9.3 mg/dL (ref 8.7–10.2)
Chloride: 104 mmol/L (ref 96–106)
Creatinine, Ser: 1.11 mg/dL — ABNORMAL HIGH (ref 0.57–1.00)
GFR calc Af Amer: 66 mL/min/{1.73_m2} (ref >59)
GFR calc non Af Amer: 57 mL/min/{1.73_m2} — ABNORMAL LOW (ref >59)
Globulin, Total: 2.4 g/dL (ref 1.5–4.5)
Glucose: 81 mg/dL (ref 65–99)
Potassium: 4.2 mmol/L (ref 3.5–5.2)
Sodium: 140 mmol/L (ref 134–144)
Total Protein: 6.8 g/dL (ref 6.0–8.5)

## 2020-02-06 ENCOUNTER — Encounter: Payer: Self-pay | Admitting: Family Medicine

## 2020-02-22 ENCOUNTER — Other Ambulatory Visit: Payer: Self-pay

## 2020-02-22 DIAGNOSIS — R944 Abnormal results of kidney function studies: Secondary | ICD-10-CM

## 2020-02-25 ENCOUNTER — Other Ambulatory Visit: Payer: Self-pay | Admitting: Family Medicine

## 2020-02-25 ENCOUNTER — Other Ambulatory Visit: Payer: BC Managed Care – PPO

## 2020-02-25 DIAGNOSIS — N182 Chronic kidney disease, stage 2 (mild): Secondary | ICD-10-CM

## 2020-02-26 LAB — COMPREHENSIVE METABOLIC PANEL
ALT: 14 IU/L (ref 0–32)
AST: 24 IU/L (ref 0–40)
Albumin/Globulin Ratio: 1.5 (ref 1.2–2.2)
Albumin: 4.6 g/dL (ref 3.8–4.9)
Alkaline Phosphatase: 86 IU/L (ref 44–121)
BUN/Creatinine Ratio: 18 (ref 9–23)
BUN: 23 mg/dL (ref 6–24)
Bilirubin Total: 0.5 mg/dL (ref 0.0–1.2)
CO2: 20 mmol/L (ref 20–29)
Calcium: 9.3 mg/dL (ref 8.7–10.2)
Chloride: 99 mmol/L (ref 96–106)
Creatinine, Ser: 1.29 mg/dL — ABNORMAL HIGH (ref 0.57–1.00)
GFR calc Af Amer: 55 mL/min/{1.73_m2} — ABNORMAL LOW (ref >59)
GFR calc non Af Amer: 48 mL/min/{1.73_m2} — ABNORMAL LOW (ref >59)
Globulin, Total: 3 g/dL (ref 1.5–4.5)
Glucose: 83 mg/dL (ref 65–99)
Potassium: 4.7 mmol/L (ref 3.5–5.2)
Sodium: 137 mmol/L (ref 134–144)
Total Protein: 7.6 g/dL (ref 6.0–8.5)

## 2020-02-27 ENCOUNTER — Other Ambulatory Visit: Payer: Self-pay

## 2020-02-27 ENCOUNTER — Other Ambulatory Visit: Payer: Self-pay | Admitting: Family Medicine

## 2020-02-27 DIAGNOSIS — I10 Essential (primary) hypertension: Secondary | ICD-10-CM

## 2020-02-27 MED ORDER — VERAPAMIL HCL ER 240 MG PO TBCR: EXTENDED_RELEASE_TABLET | ORAL | 0 refills | Status: DC

## 2020-03-05 ENCOUNTER — Other Ambulatory Visit: Payer: Self-pay | Admitting: Physician Assistant

## 2020-04-11 ENCOUNTER — Encounter: Payer: Self-pay | Admitting: Family Medicine

## 2020-04-30 NOTE — Progress Notes (Signed)
Subjective:  Patient ID: Debbie Stark, female    DOB: 1965-04-17  Age: 55 y.o. MRN: [id number]  Chief Complaint  Patient presents with  . Hyperlipidemia  . Hypertension    HPI Hyperlipidemia- Lipitor 10 mg once daily. Trying to eat healthy. GERD -Pepcid and Omeprazole.  Weight loss Management- Off Saxenda because was concerned about abnormal kidney function. Has gained some weight. Patient is asking about bariatric surgeries. Hypertension- Verapamil sr 240 mg once daily. This is also used for migraines. Has maxalt for break through migraines. Insomnia- Not needing ambien cr.   Current Outpatient Medications on File Prior to Visit  Medication Sig Dispense Refill  . atorvastatin (LIPITOR) 10 MG tablet TAKE 1 TABLET(10 MG) BY MOUTH DAILY 90 tablet 0  . cyclobenzaprine (FLEXERIL) 10 MG tablet Take 10 mg by mouth 3 (three) times daily as needed.    . famotidine (PEPCID) 20 MG tablet TAKE 1 TABLET(20 MG) BY MOUTH TWICE DAILY 180 tablet 3  . Liraglutide -Weight Management (SAXENDA) 18 MG/3ML SOPN Inject 2.4 mg into the skin once a week. (Patient not taking: Reported on 05/01/2020) 15 mL 0  . omeprazole (PRILOSEC) 40 MG capsule Take 1 capsule (40 mg total) by mouth daily. 90 capsule 3  . promethazine (PHENERGAN) 25 MG tablet Take by mouth.    . rizatriptan (MAXALT) 10 MG tablet Take one at onset of migraine/aura, repeat in 2 hours if not resolved. NO more than 2 in 24 hours. 90 tablet 1  . verapamil (CALAN-SR) 240 MG CR tablet TAKE 1 TABLET(180 MG) BY MOUTH DAILY 90 tablet 0   No current facility-administered medications on file prior to visit.   Past Medical History:  Diagnosis Date  . Anxiety   . GERD (gastroesophageal reflux disease)   . Hypertension   . IBS (irritable bowel syndrome)    with constipation  . Low back pain   . Migraine with aura, intractable, without status migrainosus    No past surgical history on file.  Family History  Problem Relation Age of Onset  . Cancer  Mother        endometrial  . Diabetes Father   . Hyperlipidemia Father   . Hypertension Father   . Heart disease Father   . Stroke Father    Social History   Socioeconomic History  . Marital status: Unknown    Spouse name: Not on file  . Number of children: Not on file  . Years of education: Not on file  . Highest education level: Not on file  Occupational History  . Not on file  Tobacco Use  . Smoking status: Never Smoker  . Smokeless tobacco: Never Used  Substance and Sexual Activity  . Alcohol use: Yes    Comment: drinks 1-2 x per month  . Drug use: Never  . Sexual activity: Not on file  Other Topics Concern  . Not on file  Social History Narrative  . Not on file   Social Determinants of Health   Financial Resource Strain: Not on file  Food Insecurity: Not on file  Transportation Needs: Not on file  Physical Activity: Not on file  Stress: Not on file  Social Connections: Not on file    Review of Systems  Constitutional: Negative for chills, fatigue and fever.  HENT: Positive for congestion. Negative for ear pain, rhinorrhea and sore throat.   Respiratory: Negative for cough and shortness of breath.   Cardiovascular: Negative for chest pain.  Gastrointestinal: Negative for abdominal  pain, constipation, diarrhea, nausea and vomiting.  Genitourinary: Positive for frequency. Negative for dysuria and urgency.  Musculoskeletal: Positive for back pain (in the middle of back. Told by back specialist it was OA. I held meloxicam 15 mg daily due to kidney function.). Negative for myalgias.  Neurological: Positive for headaches. Negative for dizziness, weakness and light-headedness.  Psychiatric/Behavioral: Negative for dysphoric mood. The patient is not nervous/anxious.      Objective:  BP 124/70   Pulse 84   Temp 97.7 F (36.5 C)   Resp 16   Ht 5\' 5"  (1.651 m)   Wt 238 lb (108 kg)   BMI 39.61 kg/m   BP/Weight 05/01/2020 02/04/2020 10/29/2019  Systolic BP 124  10/31/2019 128  Diastolic BP 70 88 74  Wt. (Lbs) 238 217.2 217.8  BMI 39.61 36.14 36.24    Physical Exam Vitals reviewed.  Constitutional:      Appearance: Normal appearance.  Cardiovascular:     Rate and Rhythm: Normal rate and regular rhythm.     Heart sounds: Normal heart sounds.  Pulmonary:     Effort: Pulmonary effort is normal.     Breath sounds: Normal breath sounds.  Abdominal:     General: Bowel sounds are normal.     Tenderness: There is no abdominal tenderness.  Musculoskeletal:        General: Tenderness (left hand over base of index finger.) present.     Cervical back: Normal range of motion.  Skin:    General: Skin is warm.  Neurological:     Mental Status: She is alert.  Psychiatric:        Mood and Affect: Mood normal.        Behavior: Behavior normal.       Lab Results  Component Value Date   WBC 6.1 05/01/2020   HGB 12.5 05/01/2020   HCT 37.9 05/01/2020   PLT 261 05/01/2020   GLUCOSE 102 (H) 05/01/2020   CHOL 165 05/01/2020   TRIG 96 05/01/2020   HDL 52 05/01/2020   LDLCALC 95 05/01/2020   ALT 19 05/01/2020   AST 21 05/01/2020   NA 140 05/01/2020   K 4.5 05/01/2020   CL 99 05/01/2020   CREATININE 1.06 (H) 05/01/2020   BUN 17 05/01/2020   CO2 27 05/01/2020   TSH 2.850 10/29/2019      Assessment & Plan:   1. Mixed hyperlipidemia At goal. The current medical regimen is effective;  continue present plan and medications. Low fat diet and exercise recommended.  - Lipid panel  2. Hypertensive kidney disease with stage 3a chronic kidney disease (HCC) Recheck CMP. If kidney function has improved, will try on meloxicam 7.5 mg once daily because her joint pain and back pain have worsened since discontinuing. - CBC with Differential/Platelet - Comprehensive metabolic panel  3. Class 1 obesity due to excess calories with serious comorbidity and body mass index (BMI) of 34.0 to 34.9 in adult Recommended pt go on website for surgeons in [place]  that perform this surgery [facility] surgery) and sign up for the information session. She did this few years ago. If still interested, will have pt come back and discuss and work towards this by coming in monthly for visits. I also recommended look at [facility] in [place].   4. Other migraine without status migrainosus, not intractable The current medical regimen is effective;  continue present plan and medications. Well controlled.   5. Gastroesophageal reflux disease without esophagitis  Well controlled.  The current medical regimen is effective;  continue present plan and medications.  6. Lumbar back pain Tylenol. Recommend meloxicam 7.5 mg once daily if kidney function has improved.   7. Left hand pain: tylenol, ice, meloxicam.   Orders Placed This Encounter  Procedures  . CBC with Differential/Platelet  . Comprehensive metabolic panel  . Lipid panel  . Cardiovascular Risk Assessment     Follow-up: Return in about 1 month (around 06/01/2020) for weight mgmt.  An After Visit Summary was printed and given to the patient.  Blane Ohara, MD [facility] [phone]

## 2020-05-01 ENCOUNTER — Ambulatory Visit: Payer: BC Managed Care – PPO | Admitting: Family Medicine

## 2020-05-01 VITALS — BP 124/70 | HR 84 | Temp 97.7°F | Resp 16 | Ht 65.0 in | Wt 238.0 lb

## 2020-05-01 DIAGNOSIS — M545 Low back pain, unspecified: Secondary | ICD-10-CM

## 2020-05-01 DIAGNOSIS — I129 Hypertensive chronic kidney disease with stage 1 through stage 4 chronic kidney disease, or unspecified chronic kidney disease: Secondary | ICD-10-CM

## 2020-05-01 DIAGNOSIS — N1831 Chronic kidney disease, stage 3a: Secondary | ICD-10-CM

## 2020-05-01 DIAGNOSIS — Z6834 Body mass index (BMI) 34.0-34.9, adult: Secondary | ICD-10-CM

## 2020-05-01 DIAGNOSIS — I10 Essential (primary) hypertension: Secondary | ICD-10-CM

## 2020-05-01 DIAGNOSIS — G43809 Other migraine, not intractable, without status migrainosus: Secondary | ICD-10-CM

## 2020-05-01 DIAGNOSIS — E782 Mixed hyperlipidemia: Secondary | ICD-10-CM | POA: Diagnosis not present

## 2020-05-01 DIAGNOSIS — S60222A Contusion of left hand, initial encounter: Secondary | ICD-10-CM

## 2020-05-01 DIAGNOSIS — E6609 Other obesity due to excess calories: Secondary | ICD-10-CM

## 2020-05-01 DIAGNOSIS — K219 Gastro-esophageal reflux disease without esophagitis: Secondary | ICD-10-CM

## 2020-05-01 NOTE — Patient Instructions (Signed)
[facility] for weight loss.  Also look at Healthy weight and wellness.   [email or web address].mata.com/.pdf">   DASH Eating Plan DASH stands for Dietary Approaches to Stop Hypertension. The DASH eating plan is a healthy eating plan that has been shown to:  Reduce high blood pressure (hypertension).  Reduce your risk for type 2 diabetes, heart disease, and stroke.  Help with weight loss. What are tips for following this plan? Reading food labels  Check food labels for the amount of salt (sodium) per serving. Choose foods with less than 5 percent of the Daily Value of sodium. Generally, foods with less than 300 milligrams (mg) of sodium per serving fit into this eating plan.  To find whole grains, look for the word "whole" as the first word in the ingredient list. Shopping  Buy products labeled as "low-sodium" or "no salt added."  Buy fresh foods. Avoid canned foods and pre-made or frozen meals. Cooking  Avoid adding salt when cooking. Use salt-free seasonings or herbs instead of table salt or sea salt. Check with your health care provider or pharmacist before using salt substitutes.  Do not fry foods. Cook foods using healthy methods such as baking, boiling, grilling, roasting, and broiling instead.  Cook with heart-healthy oils, such as olive, canola, avocado, soybean, or sunflower oil. Meal planning  Eat a balanced diet that includes: ? 4 or more servings of fruits and 4 or more servings of vegetables each day. Try to fill one-half of your plate with fruits and vegetables. ? 6-8 servings of whole grains each day. ? Less than 6 oz (170 g) of lean meat, poultry, or fish each day. A 3-oz (85-g) serving of meat is about the same size as a deck of cards. One egg equals 1 oz (28 g). ? 2-3 servings of low-fat dairy each day. One serving is 1 cup (237 mL). ? 1 serving of nuts, seeds, or beans 5 times each week. ? 2-3 servings of  heart-healthy fats. Healthy fats called omega-3 fatty acids are found in foods such as walnuts, flaxseeds, fortified milks, and eggs. These fats are also found in cold-water fish, such as sardines, salmon, and mackerel.  Limit how much you eat of: ? Canned or prepackaged foods. ? Food that is high in trans fat, such as some fried foods. ? Food that is high in saturated fat, such as fatty meat. ? Desserts and other sweets, sugary drinks, and other foods with added sugar. ? Full-fat dairy products.  Do not salt foods before eating.  Do not eat more than 4 egg yolks a week.  Try to eat at least 2 vegetarian meals a week.  Eat more home-cooked food and less restaurant, buffet, and fast food.   Lifestyle  When eating at a restaurant, ask that your food be prepared with less salt or no salt, if possible.  If you drink alcohol: ? Limit how much you use to:  0-1 drink a day for women who are not pregnant.  0-2 drinks a day for men. ? Be aware of how much alcohol is in your drink. In the [place] one drink equals one 12 oz bottle of beer (355 mL), one 5 oz glass of wine (148 mL), or one 1 oz glass of hard liquor (44 mL). General information  Avoid eating more than 2,300 mg of salt a day. If you have hypertension, you may need to reduce your sodium intake to 1,500 mg a day.  Work with your health care provider  to maintain a healthy body weight or to lose weight. Ask what an ideal weight is for you.  Get at least 30 minutes of exercise that causes your heart to beat faster (aerobic exercise) most days of the week. Activities may include walking, swimming, or biking.  Work with your health care provider or dietitian to adjust your eating plan to your individual calorie needs. What foods should I eat? Fruits All fresh, dried, or frozen fruit. Canned fruit in natural juice (without added sugar). Vegetables Fresh or frozen vegetables (raw, steamed, roasted, or grilled). Low-sodium or  reduced-sodium tomato and vegetable juice. Low-sodium or reduced-sodium tomato sauce and tomato paste. Low-sodium or reduced-sodium canned vegetables. Grains Whole-grain or whole-wheat bread. Whole-grain or whole-wheat pasta. Brown rice. Orpah Cobb. Bulgur. Whole-grain and low-sodium cereals. Pita bread. Low-fat, low-sodium crackers. Whole-wheat flour tortillas. Meats and other proteins Skinless chicken or [place]. Ground chicken or [place]. Pork with fat trimmed off. Fish and seafood. Egg whites. Dried beans, peas, or lentils. Unsalted nuts, nut butters, and seeds. Unsalted canned beans. Lean cuts of beef with fat trimmed off. Low-sodium, lean precooked or cured meat, such as sausages or meat loaves. Dairy Low-fat (1%) or fat-free (skim) milk. Reduced-fat, low-fat, or fat-free cheeses. Nonfat, low-sodium ricotta or cottage cheese. Low-fat or nonfat yogurt. Low-fat, low-sodium cheese. Fats and oils Soft margarine without trans fats. Vegetable oil. Reduced-fat, low-fat, or light mayonnaise and salad dressings (reduced-sodium). Canola, safflower, olive, avocado, soybean, and sunflower oils. Avocado. Seasonings and condiments Herbs. Spices. Seasoning mixes without salt. Other foods Unsalted popcorn and pretzels. Fat-free sweets. The items listed above may not be a complete list of foods and beverages you can eat. Contact a dietitian for more information. What foods should I avoid? Fruits Canned fruit in a light or heavy syrup. Fried fruit. Fruit in cream or butter sauce. Vegetables Creamed or fried vegetables. Vegetables in a cheese sauce. Regular canned vegetables (not low-sodium or reduced-sodium). Regular canned tomato sauce and paste (not low-sodium or reduced-sodium). Regular tomato and vegetable juice (not low-sodium or reduced-sodium). Rosita Fire. Olives. Grains Baked goods made with fat, such as croissants, muffins, or some breads. Dry pasta or rice meal packs. Meats and other  proteins Fatty cuts of meat. Ribs. Fried meat. Tomasa Blase. Bologna, salami, and other precooked or cured meats, such as sausages or meat loaves. Fat from the back of a pig (fatback). Bratwurst. Salted nuts and seeds. Canned beans with added salt. Canned or smoked fish. Whole eggs or egg yolks. Chicken or [place] with skin. Dairy Whole or 2% milk, cream, and half-and-half. Whole or full-fat cream cheese. Whole-fat or sweetened yogurt. Full-fat cheese. Nondairy creamers. Whipped toppings. Processed cheese and cheese spreads. Fats and oils Butter. Stick margarine. Lard. Shortening. Ghee. Bacon fat. Tropical oils, such as coconut, palm kernel, or palm oil. Seasonings and condiments Onion salt, garlic salt, seasoned salt, table salt, and sea salt. Worcestershire sauce. Tartar sauce. Barbecue sauce. Teriyaki sauce. Soy sauce, including reduced-sodium. Steak sauce. Canned and packaged gravies. Fish sauce. Oyster sauce. Cocktail sauce. Store-bought horseradish. Ketchup. Mustard. Meat flavorings and tenderizers. Bouillon cubes. Hot sauces. Pre-made or packaged marinades. Pre-made or packaged taco seasonings. Relishes. Regular salad dressings. Other foods Salted popcorn and pretzels. The items listed above may not be a complete list of foods and beverages you should avoid. Contact a dietitian for more information. Where to find more information  National Heart, Lung, and Blood Institute: PopSteam.is  American Heart Association: www.heart.org  Academy of Nutrition and Dietetics: www.eatright.org  National Kidney Foundation:  www.kidney.org Summary  The DASH eating plan is a healthy eating plan that has been shown to reduce high blood pressure (hypertension). It may also reduce your risk for type 2 diabetes, heart disease, and stroke.  When on the DASH eating plan, aim to eat more fresh fruits and vegetables, whole grains, lean proteins, low-fat dairy, and heart-healthy fats.  With the DASH eating plan,  you should limit salt (sodium) intake to 2,300 mg a day. If you have hypertension, you may need to reduce your sodium intake to 1,500 mg a day.  Work with your health care provider or dietitian to adjust your eating plan to your individual calorie needs. This information is not intended to replace advice given to you by your health care provider. Make sure you discuss any questions you have with your health care provider. Document Revised: 03/02/2019 Document Reviewed: 03/02/2019 Elsevier Patient Education  2021 [employer].

## 2020-05-02 ENCOUNTER — Other Ambulatory Visit: Payer: Self-pay

## 2020-05-02 LAB — CBC WITH DIFFERENTIAL/PLATELET
Basophils Absolute: 0 10*3/uL (ref 0.0–0.2)
Basos: 1 %
EOS (ABSOLUTE): 0.3 10*3/uL (ref 0.0–0.4)
Eos: 5 %
Hematocrit: 37.9 % (ref 34.0–46.6)
Hemoglobin: 12.5 g/dL (ref 11.1–15.9)
Immature Grans (Abs): 0 10*3/uL (ref 0.0–0.1)
Immature Granulocytes: 0 %
Lymphocytes Absolute: 2.1 10*3/uL (ref 0.7–3.1)
Lymphs: 34 %
MCH: 31.2 pg (ref 26.6–33.0)
MCHC: 33 g/dL (ref 31.5–35.7)
MCV: 95 fL (ref 79–97)
Monocytes Absolute: 0.4 10*3/uL (ref 0.1–0.9)
Monocytes: 7 %
Neutrophils Absolute: 3.3 10*3/uL (ref 1.4–7.0)
Neutrophils: 53 %
Platelets: 261 10*3/uL (ref 150–450)
RBC: 4.01 x10E6/uL (ref 3.77–5.28)
RDW: 11.6 % — ABNORMAL LOW (ref 11.7–15.4)
WBC: 6.1 10*3/uL (ref 3.4–10.8)

## 2020-05-02 LAB — LIPID PANEL
Chol/HDL Ratio: 3.2 ratio (ref 0.0–4.4)
Cholesterol, Total: 165 mg/dL (ref 100–199)
HDL: 52 mg/dL (ref >39)
LDL Chol Calc (NIH): 95 mg/dL (ref 0–99)
Triglycerides: 96 mg/dL (ref 0–149)
VLDL Cholesterol Cal: 18 mg/dL (ref 5–40)

## 2020-05-02 LAB — COMPREHENSIVE METABOLIC PANEL
ALT: 19 IU/L (ref 0–32)
AST: 21 IU/L (ref 0–40)
Albumin/Globulin Ratio: 1.5 (ref 1.2–2.2)
Albumin: 4.1 g/dL (ref 3.8–4.9)
Alkaline Phosphatase: 79 IU/L (ref 44–121)
BUN/Creatinine Ratio: 16 (ref 9–23)
BUN: 17 mg/dL (ref 6–24)
Bilirubin Total: 0.4 mg/dL (ref 0.0–1.2)
CO2: 27 mmol/L (ref 20–29)
Calcium: 9 mg/dL (ref 8.7–10.2)
Chloride: 99 mmol/L (ref 96–106)
Creatinine, Ser: 1.06 mg/dL — ABNORMAL HIGH (ref 0.57–1.00)
GFR calc Af Amer: 70 mL/min/{1.73_m2} (ref >59)
GFR calc non Af Amer: 61 mL/min/{1.73_m2} (ref >59)
Globulin, Total: 2.8 g/dL (ref 1.5–4.5)
Glucose: 102 mg/dL — ABNORMAL HIGH (ref 65–99)
Potassium: 4.5 mmol/L (ref 3.5–5.2)
Sodium: 140 mmol/L (ref 134–144)
Total Protein: 6.9 g/dL (ref 6.0–8.5)

## 2020-05-02 LAB — CARDIOVASCULAR RISK ASSESSMENT

## 2020-05-02 MED ORDER — MELOXICAM 7.5 MG PO TABS: 7.5000 mg | ORAL_TABLET | Freq: Every day | ORAL | 0 refills | Status: DC

## 2020-05-04 ENCOUNTER — Encounter: Payer: Self-pay | Admitting: Family Medicine

## 2020-06-02 ENCOUNTER — Other Ambulatory Visit: Payer: Self-pay

## 2020-06-02 ENCOUNTER — Ambulatory Visit: Payer: BC Managed Care – PPO | Admitting: Family Medicine

## 2020-06-02 VITALS — BP 130/80 | HR 84 | Temp 97.8°F | Resp 18 | Ht 65.0 in | Wt 238.0 lb

## 2020-06-02 DIAGNOSIS — I10 Essential (primary) hypertension: Secondary | ICD-10-CM | POA: Diagnosis not present

## 2020-06-02 DIAGNOSIS — E782 Mixed hyperlipidemia: Secondary | ICD-10-CM | POA: Diagnosis not present

## 2020-06-02 NOTE — Patient Instructions (Addendum)
http://www.harvey.com/.  A key part of healthful eating means choosing appropriate amounts of different foods. When it comes to deciding how much to eat, the terms serving size and portion size are often used interchangeably. However, they don't mean the same thing.  Serving size is a standardized amount of food. It may be used to quantify recommended amounts, as is the case with the MyPlate food groups, or represent quantities that people typically consume on a Nutrition Facts label.  Portion size is the amount of a food you choose to eat -- which may be more or less than a serving.  For example, the Nutrition Facts label may indicate  cup cereal for one serving but if you eat  cup, that is your portion size.  Estimating Portion Sizes Measuring cups and spoons are great tools for making sure your portion is the same as the serving size, however, these tools aren't always available when you're getting ready to eat. Another way to estimate your portion is by comparing it to something else.  A baseball or an average-sized fist Measures about 1 cup An appropriate portion size for raw or cooked vegetables, whole fruit or 100% fruit juice A tennis ball or small, scooped handful Measures about  cup Equal to 1-ounce equivalent for grains, such as pasta, rice and oatmeal A deck of cards or the palm of the hand Measures about 3 ounce-equivalents An appropriate portion size for fish, chicken, beef and other meats The size of the thumb Measures about 1 tablespoon An appropriate portion size for peanut butter or other nut spreads such as almond butter A postage stamp or the tip of the pointer finger to the first joint Measures about 1 teaspoon An appropriate portion size for oils or other fats Measure foods regularly to get an idea of what the serving sizes look like. It becomes easier to pick the appropriate amount as you grow more accustomed to it. While serving sizes are a valuable tool, it's  important to listen to your body while eating. If you are still hungry after eating one serving, that likely means you need more food. And if you're full on less than one serving, that's OK too.  Overcoming Portion Distortion It's easy to mistake a larger portion as a better value. To overcome portion distortion and downsize your helpings, try the following:  Read the label. The Nutrition Facts label can help you to identify the appropriate serving size. Have you noticed any changes to the Nutrition Facts labels? Many manufacturers already have started to adapt the new Nutrition Facts label on their products, and the new Nutrition Facts label will appear on all food items by April 13, 2019. Learn more about the new labels by visiting the FDA website Eat from a plate, not a package. It's easy to eat more than one serving when eating straight from the box or bag. Portion out your food first and put the container away before you start munching to keep your portion size in check. Use the right tools. Try portioning out foods with measuring cups and spoons to give yourself an idea of what the serving size looks like. Small plates and bowls can also make the portion sizes appear larger and leave you feeling more satisfied. Skip the upgrade. When dining out, it can seem like a better value to pay 50 cents extra for a larger size. If you can safely transport the food home to eat later, that might be a good deal. Otherwise just stick  to the serving size you know you can eat at one sitting without feeling too full.

## 2020-06-02 NOTE — Progress Notes (Signed)
Subjective:  Patient ID: Debbie Stark, female    DOB: 1965-09-21  Age: 55 y.o. MRN: [id number]  Chief Complaint  Patient presents with  . Hypertension  . Hyperlipidemia    HPI Patient is exercising 30 minutes a day. Pt is eating healthy and not overeating. Pt is up to saxenda 2.4 mg once daily. Previously was on phentermine which worked but caused her bp to increase.  She is very frustrated because she cannot seem to lose weight.  Current Outpatient Medications on File Prior to Visit  Medication Sig Dispense Refill  . cyclobenzaprine (FLEXERIL) 10 MG tablet Take 10 mg by mouth 3 (three) times daily as needed.    . famotidine (PEPCID) 20 MG tablet TAKE 1 TABLET(20 MG) BY MOUTH TWICE DAILY 180 tablet 3  . levonorgestrel (MIRENA) 20 MCG/24HR IUD by Intrauterine route.    . Liraglutide -Weight Management (SAXENDA) 18 MG/3ML SOPN Inject 2.4 mg into the skin once a week. (Patient not taking: Reported on 05/01/2020) 15 mL 0  . meloxicam (MOBIC) 7.5 MG tablet Take 1 tablet (7.5 mg total) by mouth daily. 30 tablet 0  . omeprazole (PRILOSEC) 40 MG capsule Take 1 capsule (40 mg total) by mouth daily. 90 capsule 3  . promethazine (PHENERGAN) 25 MG tablet Take by mouth.    . rizatriptan (MAXALT) 10 MG tablet Take one at onset of migraine/aura, repeat in 2 hours if not resolved. NO more than 2 in 24 hours. 90 tablet 1  . verapamil (CALAN-SR) 240 MG CR tablet TAKE 1 TABLET(180 MG) BY MOUTH DAILY 90 tablet 0   No current facility-administered medications on file prior to visit.   Past Medical History:  Diagnosis Date  . Anxiety   . GERD (gastroesophageal reflux disease)   . Hypertension   . IBS (irritable bowel syndrome)    with constipation  . Low back pain   . Migraine with aura, intractable, without status migrainosus    No past surgical history on file.  Family History  Problem Relation Age of Onset  . Cancer Mother        endometrial  . Diabetes Father   . Hyperlipidemia Father    . Hypertension Father   . Heart disease Father   . Stroke Father    Social History   Socioeconomic History  . Marital status: Unknown    Spouse name: Not on file  . Number of children: Not on file  . Years of education: Not on file  . Highest education level: Not on file  Occupational History  . Not on file  Tobacco Use  . Smoking status: Never Smoker  . Smokeless tobacco: Never Used  Substance and Sexual Activity  . Alcohol use: Yes    Comment: drinks 1-2 x per month  . Drug use: Never  . Sexual activity: Not on file  Other Topics Concern  . Not on file  Social History Narrative  . Not on file   Social Determinants of Health   Financial Resource Strain: Not on file  Food Insecurity: Not on file  Transportation Needs: Not on file  Physical Activity: Not on file  Stress: Not on file  Social Connections: Not on file    Review of Systems  Constitutional: Negative for chills, fatigue and fever.  HENT: Positive for sore throat. Negative for congestion and rhinorrhea.   Respiratory: Negative for cough and shortness of breath.   Cardiovascular: Negative for chest pain.  Gastrointestinal: Positive for nausea. Negative for abdominal  pain, constipation, diarrhea and vomiting.  Genitourinary: Negative for dysuria and urgency.  Musculoskeletal: Negative for back pain and myalgias.  Neurological: Negative for dizziness, weakness, light-headedness and headaches.  Psychiatric/Behavioral: Negative for dysphoric mood. The patient is not nervous/anxious.      Objective:  BP 130/80   Pulse 84   Temp 97.8 F (36.6 C)   Resp 18   Ht 5\' 5"  (1.651 m)   Wt 238 lb (108 kg)   BMI 39.61 kg/m   BP/Weight 06/02/2020 05/01/2020 02/04/2020  Systolic BP 130 124 136  Diastolic BP 80 70 88  Wt. (Lbs) 238 238 217.2  BMI 39.61 39.61 36.14    Physical Exam Constitutional:      Appearance: Normal appearance. She is obese.  Cardiovascular:     Rate and Rhythm: Normal rate and regular  rhythm.     Heart sounds: Normal heart sounds.  Pulmonary:     Effort: Pulmonary effort is normal.     Breath sounds: Normal breath sounds.  Neurological:     Mental Status: She is alert.  Psychiatric:        Mood and Affect: Mood normal.        Behavior: Behavior normal.     Diabetic Foot Exam - Simple   No data filed      Lab Results  Component Value Date   WBC 6.1 05/01/2020   HGB 12.5 05/01/2020   HCT 37.9 05/01/2020   PLT 261 05/01/2020   GLUCOSE 102 (H) 05/01/2020   CHOL 165 05/01/2020   TRIG 96 05/01/2020   HDL 52 05/01/2020   LDLCALC 95 05/01/2020   ALT 19 05/01/2020   AST 21 05/01/2020   NA 140 05/01/2020   K 4.5 05/01/2020   CL 99 05/01/2020   CREATININE 1.06 (H) 05/01/2020   BUN 17 05/01/2020   CO2 27 05/01/2020   TSH 2.850 10/29/2019      Assessment & Plan:   1. Mixed hyperlipidemia Continue lipitor. Pt needs to lose weight.  2. Benign hypertension Well controlled. Continue current medications  3. Morbid obesity (HCC) Stop saxenda as it is not helping.  Low fat, low sugar diet. Increase exercise. - Ambulatory referral to General Surgery (bariatric surgery)   Orders Placed This Encounter  Procedures  . Ambulatory referral to General Surgery     Follow-up: Return in about 4 weeks (around 06/30/2020).  An After Visit Summary was printed and given to the patient.  07/02/2020, MD [facility] [phone]

## 2020-06-05 ENCOUNTER — Other Ambulatory Visit: Payer: Self-pay | Admitting: Physician Assistant

## 2020-06-09 ENCOUNTER — Encounter: Payer: Self-pay | Admitting: Family Medicine

## 2020-06-16 ENCOUNTER — Telehealth: Payer: Self-pay

## 2020-06-16 ENCOUNTER — Telehealth (INDEPENDENT_AMBULATORY_CARE_PROVIDER_SITE_OTHER): Payer: BC Managed Care – PPO | Admitting: Nurse Practitioner

## 2020-06-16 ENCOUNTER — Encounter: Payer: Self-pay | Admitting: Nurse Practitioner

## 2020-06-16 VITALS — Ht 65.0 in | Wt 238.0 lb

## 2020-06-16 DIAGNOSIS — J018 Other acute sinusitis: Secondary | ICD-10-CM | POA: Diagnosis not present

## 2020-06-16 MED ORDER — AMOXICILLIN 875 MG PO TABS: 875.0000 mg | ORAL_TABLET | Freq: Two times a day (BID) | ORAL | 0 refills | Status: DC

## 2020-06-16 MED ORDER — FLUTICASONE PROPIONATE 50 MCG/ACT NA SUSP: 2.0000 | Freq: Every day | NASAL | 6 refills | Status: DC

## 2020-06-16 NOTE — Progress Notes (Deleted)
Virtual Visit via Telephone Note   This visit type was conducted due to national recommendations for restrictions regarding the COVID-19 Pandemic (e.g. social distancing) in an effort to limit this patient's exposure and mitigate transmission in our community.  Due to her co-morbid illnesses, this patient is at least at moderate risk for complications without adequate follow up.  This format is felt to be most appropriate for this patient at this time.  The patient did not have access to video technology/had technical difficulties with video requiring transitioning to audio format only (telephone).  All issues noted in this document were discussed and addressed.  No physical exam could be performed with this format.  Patient verbally consented to a telehealth visit.   Date:  06/16/2020   ID:  Debbie Stark, DOB [birth year], MRN [id number]  {Patient Location:[phone]::"Home"} {Provider [phone]::"Home Office"}  PCP:  Blane Ohara, MD   Evaluation Performed:  {Choose Visit [phone]::"Follow-Up Visit"}  Chief Complaint:  ***  History of Present Illness:    Debbie Stark is a 55 y.o. female with ***  The patient {does/does not:200015} have symptoms concerning for COVID-19 infection (fever, chills, cough, or new shortness of breath).    Past Medical History:  Diagnosis Date  . Anxiety   . GERD (gastroesophageal reflux disease)   . Hypertension   . IBS (irritable bowel syndrome)    with constipation  . Low back pain   . Migraine with aura, intractable, without status migrainosus     No past surgical history on file.  Family History  Problem Relation Age of Onset  . Cancer Mother        endometrial  . Diabetes Father   . Hyperlipidemia Father   . Hypertension Father   . Heart disease Father   . Stroke Father     Social History   Socioeconomic History  . Marital status: Unknown    Spouse name: Not on file  . Number of children: Not on file  . Years  of education: Not on file  . Highest education level: Not on file  Occupational History  . Not on file  Tobacco Use  . Smoking status: Never Smoker  . Smokeless tobacco: Never Used  Substance and Sexual Activity  . Alcohol use: Yes    Comment: drinks 1-2 x per month  . Drug use: Never  . Sexual activity: Not on file  Other Topics Concern  . Not on file  Social History Narrative  . Not on file   Social Determinants of Health   Financial Resource Strain: Not on file  Food Insecurity: Not on file  Transportation Needs: Not on file  Physical Activity: Not on file  Stress: Not on file  Social Connections: Not on file  Intimate Partner Violence: Not on file    Outpatient Medications Prior to Visit  Medication Sig Dispense Refill  . atorvastatin (LIPITOR) 10 MG tablet TAKE 1 TABLET(10 MG) BY MOUTH DAILY 90 tablet 0  . cyclobenzaprine (FLEXERIL) 10 MG tablet Take 10 mg by mouth 3 (three) times daily as needed.    . famotidine (PEPCID) 20 MG tablet TAKE 1 TABLET(20 MG) BY MOUTH TWICE DAILY 180 tablet 3  . levonorgestrel (MIRENA) 20 MCG/24HR IUD by Intrauterine route.    . Liraglutide -Weight Management (SAXENDA) 18 MG/3ML SOPN Inject 2.4 mg into the skin once a week. (Patient not taking: Reported on 05/01/2020) 15 mL 0  . meloxicam (MOBIC) 7.5 MG tablet Take 1 tablet (7.5 mg total)  by mouth daily. 30 tablet 0  . omeprazole (PRILOSEC) 40 MG capsule Take 1 capsule (40 mg total) by mouth daily. 90 capsule 3  . promethazine (PHENERGAN) 25 MG tablet Take by mouth.    . rizatriptan (MAXALT) 10 MG tablet Take one at onset of migraine/aura, repeat in 2 hours if not resolved. NO more than 2 in 24 hours. 90 tablet 1  . verapamil (CALAN-SR) 240 MG CR tablet TAKE 1 TABLET(180 MG) BY MOUTH DAILY 90 tablet 0   No facility-administered medications prior to visit.    Allergies:   Sulfa antibiotics   Social History   Tobacco Use  . Smoking status: Never Smoker  . Smokeless tobacco: Never Used   Substance Use Topics  . Alcohol use: Yes    Comment: drinks 1-2 x per month  . Drug use: Never     ROS   Labs/Other Tests and Data Reviewed:    Recent Labs: 10/29/2019: TSH 2.850 05/01/2020: ALT 19; BUN 17; Creatinine, Ser 1.06; Hemoglobin 12.5; Platelets 261; Potassium 4.5; Sodium 140   Recent Lipid Panel Lab Results  Component Value Date/Time   CHOL 165 05/01/2020 08:19 AM   TRIG 96 05/01/2020 08:19 AM   HDL 52 05/01/2020 08:19 AM   CHOLHDL 3.2 05/01/2020 08:19 AM   LDLCALC 95 05/01/2020 08:19 AM    Wt Readings from Last 3 Encounters:  06/02/20 238 lb (108 kg)  05/01/20 238 lb (108 kg)  02/04/20 217 lb 3.2 oz (98.5 kg)     Objective:    Vital Signs:  There were no vitals taken for this visit.   Physical Exam   ASSESSMENT & PLAN:   There are no diagnoses linked to this encounter.   No orders of the defined types were placed in this encounter.    No orders of the defined types were placed in this encounter.   COVID-19 Education: The signs and symptoms of COVID-19 were discussed with the patient and how to seek care for testing (follow up with PCP or arrange E-visit). The importance of social distancing was discussed today.   I spent < time > minutes dedicated to the care of this patient on the date of this encounter to include face-to-face time with the patient, as well as: ***  Follow Up:  {F/U Format:[phone]} {follow up:15908}  Signed,  Janie Morning, NP  06/16/2020 9:49 AM    [facility] [place]

## 2020-06-16 NOTE — Telephone Encounter (Signed)
PA submitted and approved for Saxenda via [email or web address] 06/16/20--06/16/2021.  Member ID# is [id number]

## 2020-06-16 NOTE — Progress Notes (Signed)
Virtual Visit via Telephone Note   This visit type was conducted due to national recommendations for restrictions regarding the COVID-19 Pandemic (e.g. social distancing) in an effort to limit this patient's exposure and mitigate transmission in our community.  Due to her co-morbid illnesses, this patient is at least at moderate risk for complications without adequate follow up.  This format is felt to be most appropriate for this patient at this time.  The patient did not have access to video technology/had technical difficulties with video requiring transitioning to audio format only (telephone).  All issues noted in this document were discussed and addressed.  No physical exam could be performed with this format.  Patient verbally consented to a telehealth visit.   Date:  06/16/2020   ID:  Debbie Stark, DOB [birth year], MRN [id number]  Patient Location: Home Provider Location: Office/Clinic  PCP:  Blane Ohara, MD   Evaluation Performed:  Established patient   Chief Complaint: Sinus congestion   History of Present Illness:    Debbie Stark is a 55 y.o. female with sinus congestion, sinus tenderness/pressure, rhinorrhea, post-nasal-drip, and headache.Onset of symptoms was 2-weeks ago. Treatment has included Zyrtec 10 mg daily.  Debbie Stark has a history of chronic allergic rhinitis. She tells me "sinus symptoms" worsen during pollen season.   The patient does have symptoms concerning for COVID-19 infection (fever, chills, cough, or new shortness of breath).    Past Medical History:  Diagnosis Date  . Anxiety   . GERD (gastroesophageal reflux disease)   . Hypertension   . IBS (irritable bowel syndrome)    with constipation  . Low back pain   . Migraine with aura, intractable, without status migrainosus     No past surgical history on file.  Family History  Problem Relation Age of Onset  . Cancer Mother        endometrial  . Diabetes Father   . Hyperlipidemia Father   .  Hypertension Father   . Heart disease Father   . Stroke Father     Social History   Socioeconomic History  . Marital status: Unknown    Spouse name: Not on file  . Number of children: Not on file  . Years of education: Not on file  . Highest education level: Not on file  Occupational History  . Not on file  Tobacco Use  . Smoking status: Never Smoker  . Smokeless tobacco: Never Used  Substance and Sexual Activity  . Alcohol use: Yes    Comment: drinks 1-2 x per month  . Drug use: Never  . Sexual activity: Not on file  Other Topics Concern  . Not on file  Social History Narrative  . Not on file   Social Determinants of Health   Financial Resource Strain: Not on file  Food Insecurity: Not on file  Transportation Needs: Not on file  Physical Activity: Not on file  Stress: Not on file  Social Connections: Not on file  Intimate Partner Violence: Not on file    Outpatient Medications Prior to Visit  Medication Sig Dispense Refill  . atorvastatin (LIPITOR) 10 MG tablet TAKE 1 TABLET(10 MG) BY MOUTH DAILY 90 tablet 0  . cetirizine (ZYRTEC) 10 MG tablet Take 10 mg by mouth daily.    . cyclobenzaprine (FLEXERIL) 10 MG tablet Take 10 mg by mouth 3 (three) times daily as needed.    . famotidine (PEPCID) 20 MG tablet TAKE 1 TABLET(20 MG) BY MOUTH TWICE DAILY 180 tablet  3  . levonorgestrel (MIRENA) 20 MCG/24HR IUD by Intrauterine route.    . Liraglutide -Weight Management (SAXENDA) 18 MG/3ML SOPN Inject 2.4 mg into the skin once a week. 15 mL 0  . meloxicam (MOBIC) 7.5 MG tablet Take 1 tablet (7.5 mg total) by mouth daily. 30 tablet 0  . omeprazole (PRILOSEC) 40 MG capsule Take 1 capsule (40 mg total) by mouth daily. 90 capsule 3  . promethazine (PHENERGAN) 25 MG tablet Take by mouth.    . Pseudoephedrine-APAP-DM (DAYQUIL PO) Take by mouth.    . rizatriptan (MAXALT) 10 MG tablet Take one at onset of migraine/aura, repeat in 2 hours if not resolved. NO more than 2 in 24 hours. 90  tablet 1  . verapamil (CALAN-SR) 240 MG CR tablet TAKE 1 TABLET(180 MG) BY MOUTH DAILY 90 tablet 0   No facility-administered medications prior to visit.    Allergies:   Sulfa antibiotics   Social History   Tobacco Use  . Smoking status: Never Smoker  . Smokeless tobacco: Never Used  Substance Use Topics  . Alcohol use: Yes    Comment: drinks 1-2 x per month  . Drug use: Never     Review of Systems  Constitutional: Positive for malaise/fatigue. Negative for chills and fever.  HENT: Positive for congestion (rhinorrhea, post-nasal-drip) and sinus pain (pressure/tenderness). Negative for sore throat.   Respiratory: Negative for cough and shortness of breath.   Cardiovascular: Negative for chest pain and orthopnea.  Gastrointestinal: Negative for abdominal pain, diarrhea, nausea and vomiting.  Musculoskeletal: Negative for back pain, myalgias and neck pain.  Skin: Negative for rash.  Neurological: Positive for headaches. Negative for dizziness.     Labs/Other Tests and Data Reviewed:    Recent Labs: 10/29/2019: TSH 2.850 05/01/2020: ALT 19; BUN 17; Creatinine, Ser 1.06; Hemoglobin 12.5; Platelets 261; Potassium 4.5; Sodium 140   Recent Lipid Panel Lab Results  Component Value Date/Time   CHOL 165 05/01/2020 08:19 AM   TRIG 96 05/01/2020 08:19 AM   HDL 52 05/01/2020 08:19 AM   CHOLHDL 3.2 05/01/2020 08:19 AM   LDLCALC 95 05/01/2020 08:19 AM    Wt Readings from Last 3 Encounters:  06/16/20 238 lb (108 kg)  06/02/20 238 lb (108 kg)  05/01/20 238 lb (108 kg)     Objective:    Vital Signs:  Ht 5\' 5"  (1.651 m)   Wt 238 lb (108 kg)   BMI 39.61 kg/m    Physical Exam No physical exam performed due to telemedicine visit  ASSESSMENT & PLAN:    1. Other acute sinusitis, recurrence not specified - fluticasone (FLONASE) 50 MCG/ACT nasal spray; Place 2 sprays into both nostrils daily.  Dispense: 16 g; Refill: 6 - amoxicillin (AMOXIL) 875 MG tablet; Take 1 tablet (875 mg  total) by mouth 2 (two) times daily.  Dispense: 14 tablet; Refill: 0  Continue Zyrtec 10 mg daily Rest and push fluids Notify office if symptoms fail to improve or worsen  COVID-19 Education: The signs and symptoms of COVID-19 were discussed with the patient and how to seek care for testing (follow up with PCP or arrange E-visit). The importance of social distancing was discussed today.   I spent 10 minutes dedicated to the care of this patient on the date of this encounter to include face-to-face time with the patient, as well as: EMR review and prescription medication management.  Follow Up:  Virtual Visit  prn  Signed,  , NP  06/16/2020 9:50  AM    [facility]

## 2020-06-26 NOTE — Progress Notes (Signed)
Subjective:  Patient ID: Debbie Stark, female    DOB: December 31, 1965  Age: 55 y.o. MRN: [id number]  Chief Complaint  Patient presents with  . Follow-up    4 week     HPI  Hypertension, hyperlipidemia/obesity Patient has into bariatric surgery and has brought information for me to fill out. Gained weight over the last 3 months despite taking saxenda.  Pt's appetite is suppressed. Drinking water.  Breakfast: Yogurt low fat and protein bar.  Lunch: sandwich  Supper: sometimes skips meals. Eats pasta, pizza (veggie.) eats apples.  Patient has been on phentermine in the past and lost 100 pounds. Has been tried on qsymia, contrave, and saxenda. Her weight has ranged from 178 to 242 lbs. She has followed the keto diet, beach body diet and Nutrisystem in the past.  She loses weight but then regained some typically.   Current Outpatient Medications on File Prior to Visit  Medication Sig Dispense Refill  . atorvastatin (LIPITOR) 10 MG tablet TAKE 1 TABLET(10 MG) BY MOUTH DAILY 90 tablet 0  . cetirizine (ZYRTEC) 10 MG tablet Take 10 mg by mouth daily.    . cyclobenzaprine (FLEXERIL) 10 MG tablet Take 10 mg by mouth 3 (three) times daily as needed.    . famotidine (PEPCID) 20 MG tablet TAKE 1 TABLET(20 MG) BY MOUTH TWICE DAILY 180 tablet 3  . fluticasone (FLONASE) 50 MCG/ACT nasal spray Place 2 sprays into both nostrils daily. 16 g 6  . Liraglutide -Weight Management (SAXENDA) 18 MG/3ML SOPN Inject 2.4 mg into the skin once a week. 15 mL 0  . meloxicam (MOBIC) 7.5 MG tablet Take 1 tablet (7.5 mg total) by mouth daily. 30 tablet 0  . omeprazole (PRILOSEC) 40 MG capsule Take 1 capsule (40 mg total) by mouth daily. 90 capsule 3  . promethazine (PHENERGAN) 25 MG tablet Take by mouth.    . rizatriptan (MAXALT) 10 MG tablet Take one at onset of migraine/aura, repeat in 2 hours if not resolved. NO more than 2 in 24 hours. 90 tablet 1  . verapamil (CALAN-SR) 240 MG CR tablet TAKE 1 TABLET(180 MG) BY  MOUTH DAILY 90 tablet 0   No current facility-administered medications on file prior to visit.   Past Medical History:  Diagnosis Date  . Anxiety   . GERD (gastroesophageal reflux disease)   . Hypertension   . IBS (irritable bowel syndrome)    with constipation  . Low back pain   . Migraine with aura, intractable, without status migrainosus    No past surgical history on file.  Family History  Problem Relation Age of Onset  . Cancer Mother        endometrial  . Diabetes Father   . Hyperlipidemia Father   . Hypertension Father   . Heart disease Father   . Stroke Father    Social History   Socioeconomic History  . Marital status: Unknown    Spouse name: Not on file  . Number of children: Not on file  . Years of education: Not on file  . Highest education level: Not on file  Occupational History  . Not on file  Tobacco Use  . Smoking status: Never Smoker  . Smokeless tobacco: Never Used  Substance and Sexual Activity  . Alcohol use: Yes    Comment: drinks 1-2 x per month  . Drug use: Never  . Sexual activity: Not on file  Other Topics Concern  . Not on file  Social History Narrative  .  Not on file   Social Determinants of Health   Financial Resource Strain: Not on file  Food Insecurity: Not on file  Transportation Needs: Not on file  Physical Activity: Not on file  Stress: Not on file  Social Connections: Not on file    Review of Systems  Constitutional: Positive for fatigue and unexpected weight change. Negative for chills and fever.  HENT: Negative for congestion, ear pain, rhinorrhea and sore throat.   Eyes: Positive for visual disturbance.  Respiratory: Negative for cough and shortness of breath.   Cardiovascular: Negative for chest pain.  Gastrointestinal: Positive for constipation. Negative for abdominal pain, diarrhea, nausea and vomiting.  Genitourinary: Negative for dysuria and urgency.  Musculoskeletal: Positive for arthralgias (hand pain).  Negative for back pain and myalgias.  Skin: Positive for color change (lip discoloration over the last 2 months. ).  Neurological: Positive for headaches. Negative for dizziness, weakness and light-headedness.  Psychiatric/Behavioral: Negative for dysphoric mood. The patient is not nervous/anxious.      Objective:  BP 116/78   Pulse 80   Temp 98 F (36.7 C)   Resp 16   Ht 5\' 5"  (1.651 m)   Wt 242 lb (109.8 kg)   BMI 40.27 kg/m   BP/Weight 06/30/2020 06/16/2020 06/02/2020  Systolic BP 116 - 130  Diastolic BP 78 - 80  Wt. (Lbs) 242 238 238  BMI 40.27 39.61 39.61    Physical Exam Vitals reviewed.  Constitutional:      Appearance: Normal appearance. She is normal weight.  Neck:     Vascular: No carotid bruit.  Cardiovascular:     Rate and Rhythm: Normal rate and regular rhythm.     Pulses: Normal pulses.     Heart sounds: Normal heart sounds.  Pulmonary:     Effort: Pulmonary effort is normal. No respiratory distress.     Breath sounds: Normal breath sounds.  Abdominal:     General: Abdomen is flat. Bowel sounds are normal.     Palpations: Abdomen is soft.     Tenderness: There is no abdominal tenderness.  Musculoskeletal:        General: Tenderness (left hand (finger) had njury few months agoa nd has not gotten better. ) present.  Skin:    Findings: Lesion (top lip rt side -discoloration) present.  Neurological:     Mental Status: She is alert and oriented to person, place, and time.  Psychiatric:        Mood and Affect: Mood normal.        Behavior: Behavior normal.      Lab Results  Component Value Date   WBC 6.1 05/01/2020   HGB 12.5 05/01/2020   HCT 37.9 05/01/2020   PLT 261 05/01/2020   GLUCOSE 102 (H) 05/01/2020   CHOL 165 05/01/2020   TRIG 96 05/01/2020   HDL 52 05/01/2020   LDLCALC 95 05/01/2020   ALT 19 05/01/2020   AST 21 05/01/2020   NA 140 05/01/2020   K 4.5 05/01/2020   CL 99 05/01/2020   CREATININE 1.06 (H) 05/01/2020   BUN 17 05/01/2020    CO2 27 05/01/2020   TSH 2.850 10/29/2019      Assessment & Plan:   1. Other fatigue - Home sleep test  2. Snoring - Home sleep test  3. Frequent headaches/migraines Rule out sleep apnea.  Continue verapamil and maxalt.  4. Mixed hyperlipidemia Well controlled.  No changes to medicines.  Continue to work on eating a healthy diet  and exercise.   5. Benign hypertension Well controlled.  No changes to medicines.  Continue to work on eating a healthy diet and exercise.   6. Hypertensive kidney disease with stage 3a chronic kidney disease (HCC) Stable.  The current medical regimen is effective;  continue present plan and medications.  7. Left hand pain Persistent.  - Ambulatory referral to Orthopedic Surgery: Dr. Amanda Pea  8. Lesion of lip - Ambulatory referral to Dermatology  9. Severe obesity with body mass index (BMI) of 36.0 to 36.9 with serious comorbidity (HCC) Recommend continue to work on eating healthy diet and exercise. Stop saxenda as it is not helping.  Bariatric surgery forms filled out.  Pt to follow up monthly with either me or the bariatric surgeons.    Orders Placed This Encounter  Procedures  . Home sleep test     Follow-up: Return in about 4 weeks (around 07/28/2020).  An After Visit Summary was printed and given to the patient.  Blane Ohara, MD [facility] [phone]

## 2020-06-30 ENCOUNTER — Ambulatory Visit: Payer: BC Managed Care – PPO | Admitting: Family Medicine

## 2020-06-30 ENCOUNTER — Other Ambulatory Visit: Payer: Self-pay

## 2020-06-30 VITALS — BP 116/78 | HR 80 | Temp 98.0°F | Resp 16 | Ht 65.0 in | Wt 242.0 lb

## 2020-06-30 DIAGNOSIS — R0683 Snoring: Secondary | ICD-10-CM

## 2020-06-30 DIAGNOSIS — R519 Headache, unspecified: Secondary | ICD-10-CM | POA: Diagnosis not present

## 2020-06-30 DIAGNOSIS — I10 Essential (primary) hypertension: Secondary | ICD-10-CM

## 2020-06-30 DIAGNOSIS — R5383 Other fatigue: Secondary | ICD-10-CM

## 2020-06-30 DIAGNOSIS — Z6836 Body mass index (BMI) 36.0-36.9, adult: Secondary | ICD-10-CM

## 2020-06-30 DIAGNOSIS — E6609 Other obesity due to excess calories: Secondary | ICD-10-CM

## 2020-06-30 DIAGNOSIS — K130 Diseases of lips: Secondary | ICD-10-CM

## 2020-06-30 DIAGNOSIS — M79642 Pain in left hand: Secondary | ICD-10-CM

## 2020-06-30 DIAGNOSIS — E782 Mixed hyperlipidemia: Secondary | ICD-10-CM

## 2020-06-30 DIAGNOSIS — N1831 Chronic kidney disease, stage 3a: Secondary | ICD-10-CM

## 2020-06-30 DIAGNOSIS — I129 Hypertensive chronic kidney disease with stage 1 through stage 4 chronic kidney disease, or unspecified chronic kidney disease: Secondary | ICD-10-CM

## 2020-06-30 NOTE — Patient Instructions (Signed)
Stop saxenda. Refer to [facility].  Refer to [facility].  Ordering home sleep study.

## 2020-07-01 ENCOUNTER — Telehealth: Payer: Self-pay

## 2020-07-01 DIAGNOSIS — R0683 Snoring: Secondary | ICD-10-CM

## 2020-07-01 DIAGNOSIS — R5383 Other fatigue: Secondary | ICD-10-CM

## 2020-07-01 NOTE — Telephone Encounter (Signed)
Home Sleep order faxed over to [facility], waiting to hear from them an they are going to check on ins. For this, we should here back via fax from them.

## 2020-07-11 DIAGNOSIS — G4730 Sleep apnea, unspecified: Secondary | ICD-10-CM

## 2020-07-11 HISTORY — DX: Sleep apnea, unspecified: G47.30

## 2020-07-18 ENCOUNTER — Telehealth: Payer: Self-pay

## 2020-07-18 NOTE — Telephone Encounter (Signed)
Jream called to about getting referrals to hand surgeon and dermatology about her issues that were discussed at her visit.

## 2020-07-21 ENCOUNTER — Encounter: Payer: Self-pay | Admitting: Family Medicine

## 2020-07-21 NOTE — Telephone Encounter (Signed)
Note complete, letter complete, forms filled out.  I faxed her letter for bariatric surgery.  I would like to print out a graph of weights from [facility]. I tried from home, but the printer said it was out of paper.

## 2020-07-24 ENCOUNTER — Other Ambulatory Visit (HOSPITAL_COMMUNITY): Payer: Self-pay | Admitting: Surgery

## 2020-07-24 ENCOUNTER — Other Ambulatory Visit: Payer: Self-pay | Admitting: Surgery

## 2020-07-28 ENCOUNTER — Ambulatory Visit: Payer: BC Managed Care – PPO | Admitting: Orthopedic Surgery

## 2020-07-30 ENCOUNTER — Other Ambulatory Visit: Payer: Self-pay

## 2020-07-30 ENCOUNTER — Ambulatory Visit (HOSPITAL_COMMUNITY)
Admission: RE | Admit: 2020-07-30 | Discharge: 2020-07-30 | Disposition: A | Discharge status: Home / Self Care | Payer: BC Managed Care – PPO | Source: Ambulatory Visit | Attending: Surgery | Admitting: Surgery

## 2020-07-30 ENCOUNTER — Other Ambulatory Visit: Payer: Self-pay | Admitting: Family Medicine

## 2020-07-30 DIAGNOSIS — I10 Essential (primary) hypertension: Secondary | ICD-10-CM

## 2020-07-31 ENCOUNTER — Ambulatory Visit: Payer: BC Managed Care – PPO | Admitting: Family Medicine

## 2020-07-31 ENCOUNTER — Encounter: Payer: Self-pay | Admitting: Family Medicine

## 2020-07-31 VITALS — BP 124/70 | HR 70 | Temp 97.5°F | Resp 16 | Ht 65.0 in | Wt 241.0 lb

## 2020-07-31 DIAGNOSIS — G4733 Obstructive sleep apnea (adult) (pediatric): Secondary | ICD-10-CM

## 2020-07-31 LAB — POCT URINALYSIS DIP (CLINITEK)
Bilirubin, UA: NEGATIVE
Blood, UA: NEGATIVE
Glucose, UA: NEGATIVE mg/dL
Ketones, POC UA: NEGATIVE mg/dL
Leukocytes, UA: NEGATIVE
Nitrite, UA: NEGATIVE
Spec Grav, UA: 1.015 (ref 1.010–1.025)
Urobilinogen, UA: 1 E.U./dL
pH, UA: 6 (ref 5.0–8.0)

## 2020-07-31 NOTE — Progress Notes (Signed)
Subjective:  Patient ID: Debbie Stark, female    DOB: October 26, 1965  Age: 55 y.o. MRN: [id number]  Chief Complaint  Patient presents with  . Sleep Apnea    HPI  Patient here to complete some labwork for bariatric surgery. Order from Careers adviser. Patient was found to have OSA. Pt needs to get cpap.  Patient is scheduled to see GI for EGD and possibly Colonoscopy.   Current Outpatient Medications on File Prior to Visit  Medication Sig Dispense Refill  . atorvastatin (LIPITOR) 10 MG tablet TAKE 1 TABLET(10 MG) BY MOUTH DAILY 90 tablet 0  . cetirizine (ZYRTEC) 10 MG tablet Take 10 mg by mouth daily.    . cyclobenzaprine (FLEXERIL) 10 MG tablet Take 10 mg by mouth 3 (three) times daily as needed.    . famotidine (PEPCID) 20 MG tablet TAKE 1 TABLET(20 MG) BY MOUTH TWICE DAILY 180 tablet 3  . fluticasone (FLONASE) 50 MCG/ACT nasal spray Place 2 sprays into both nostrils daily. 16 g 6  . meloxicam (MOBIC) 7.5 MG tablet Take 1 tablet (7.5 mg total) by mouth daily. 30 tablet 0  . omeprazole (PRILOSEC) 40 MG capsule Take 1 capsule (40 mg total) by mouth daily. 90 capsule 3  . promethazine (PHENERGAN) 25 MG tablet Take by mouth.    . rizatriptan (MAXALT) 10 MG tablet Take one at onset of migraine/aura, repeat in 2 hours if not resolved. NO more than 2 in 24 hours. 90 tablet 1  . verapamil (CALAN-SR) 240 MG CR tablet TAKE ONE TABLET BY MOUTH DAILY 90 tablet 0   No current facility-administered medications on file prior to visit.   Past Medical History:  Diagnosis Date  . Anxiety   . GERD (gastroesophageal reflux disease)   . Hypertension   . IBS (irritable bowel syndrome)    with constipation  . Low back pain   . Migraine with aura, intractable, without status migrainosus    History reviewed. No pertinent surgical history.  Family History  Problem Relation Age of Onset  . Cancer Mother        endometrial  . Diabetes Father   . Hyperlipidemia Father   . Hypertension Father   . Heart  disease Father   . Stroke Father    Social History   Socioeconomic History  . Marital status: Married    Spouse name: Not on file  . Number of children: Not on file  . Years of education: Not on file  . Highest education level: Not on file  Occupational History  . Not on file  Tobacco Use  . Smoking status: Never Smoker  . Smokeless tobacco: Never Used  Substance and Sexual Activity  . Alcohol use: Yes    Comment: drinks 1-2 x per month  . Drug use: Never  . Sexual activity: Not Currently  Other Topics Concern  . Not on file  Social History Narrative  . Not on file   Social Determinants of Health   Financial Resource Strain: Not on file  Food Insecurity: Not on file  Transportation Needs: Not on file  Physical Activity: Not on file  Stress: Not on file  Social Connections: Not on file    Review of Systems  Constitutional: Negative for chills, fatigue and fever.  HENT: Negative for congestion, ear pain and sore throat.   Respiratory: Negative for cough and shortness of breath.   Cardiovascular: Negative for chest pain and palpitations.  Gastrointestinal: Positive for abdominal pain. Negative for constipation, diarrhea,  nausea and vomiting.  Endocrine: Negative for polydipsia, polyphagia and polyuria.  Genitourinary: Negative for difficulty urinating and dysuria.  Musculoskeletal: Positive for back pain. Negative for arthralgias and myalgias.  Skin: Negative for rash.  Neurological: Negative for headaches.  Psychiatric/Behavioral: Negative for dysphoric mood. The patient is not nervous/anxious.     Objective:  BP 124/70   Pulse 70   Temp (!) 97.5 F (36.4 C)   Resp 16   Ht 5\' 5"  (1.651 m)   Wt 241 lb (109.3 kg)   SpO2 98%   BMI 40.10 kg/m   BP/Weight 07/31/2020 06/30/2020 06/16/2020  Systolic BP 124 116 -  Diastolic BP 70 78 -  Wt. (Lbs) 241 242 238  BMI 40.1 40.27 39.61    Physical Exam Vitals reviewed.  Constitutional:      Appearance: Normal  appearance. She is obese.  Cardiovascular:     Rate and Rhythm: Normal rate and regular rhythm.     Heart sounds: Normal heart sounds.  Pulmonary:     Effort: Pulmonary effort is normal.     Breath sounds: Normal breath sounds.  Neurological:     Mental Status: She is alert and oriented to person, place, and time.  Psychiatric:        Mood and Affect: Mood normal.        Behavior: Behavior normal.     Diabetic Foot Exam - Simple   No data filed      Lab Results  Component Value Date   WBC 6.1 05/01/2020   HGB 12.5 05/01/2020   HCT 37.9 05/01/2020   PLT 261 05/01/2020   GLUCOSE 102 (H) 05/01/2020   CHOL 165 05/01/2020   TRIG 96 05/01/2020   HDL 52 05/01/2020   LDLCALC 95 05/01/2020   ALT 19 05/01/2020   AST 21 05/01/2020   NA 140 05/01/2020   K 4.5 05/01/2020   CL 99 05/01/2020   CREATININE 1.06 (H) 05/01/2020   BUN 17 05/01/2020   CO2 27 05/01/2020   TSH 2.190 07/31/2020   INR 1.0 07/31/2020      Assessment & Plan:   1. OSA (obstructive sleep apnea) - Ambulatory Referral for DME: APAP 5-20.  2. Morbid obesity (HCC) Recommend continue to work on eating healthy diet and exercise. Plan to see bariatric surgeon and undergo preop clearance (psychologist, gastroenterologist.) - TSH - POCT URINALYSIS DIP (CLINITEK) - Iron - B12 and Folate Panel - Protime-INR   Orders Placed This Encounter  Procedures  . TSH  . Iron  . B12 and Folate Panel  . Protime-INR  . Ambulatory Referral for DME  . POCT URINALYSIS DIP (CLINITEK)     Follow-up: No follow-ups on file.  An After Visit Summary was printed and given to the patient.  08/02/2020, MD [facility] [phone]

## 2020-08-01 LAB — B12 AND FOLATE PANEL
Folate: 11.5 ng/mL (ref >3.0)
Vitamin B-12: 311 pg/mL (ref 232–1245)

## 2020-08-01 LAB — PROTIME-INR
INR: 1 (ref 0.9–1.2)
Prothrombin Time: 10.1 s (ref 9.1–12.0)

## 2020-08-01 LAB — TSH: TSH: 2.19 u[IU]/mL (ref 0.450–4.500)

## 2020-08-01 LAB — IRON: Iron: 65 ug/dL (ref 27–159)

## 2020-08-26 ENCOUNTER — Institutional Professional Consult (permissible substitution): Payer: BC Managed Care – PPO | Admitting: Pulmonary Disease

## 2020-09-03 ENCOUNTER — Encounter: Payer: BC Managed Care – PPO | Attending: Surgery | Admitting: Skilled Nursing Facility1

## 2020-09-03 ENCOUNTER — Encounter: Payer: Self-pay | Admitting: Skilled Nursing Facility1

## 2020-09-03 ENCOUNTER — Other Ambulatory Visit: Payer: Self-pay

## 2020-09-03 DIAGNOSIS — Z6834 Body mass index (BMI) 34.0-34.9, adult: Secondary | ICD-10-CM | POA: Insufficient documentation

## 2020-09-03 DIAGNOSIS — E6609 Other obesity due to excess calories: Secondary | ICD-10-CM
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
## 2020-09-18 ENCOUNTER — Other Ambulatory Visit: Payer: Self-pay | Admitting: Family Medicine

## 2020-10-01 LAB — HM COLONOSCOPY

## 2020-10-08 ENCOUNTER — Other Ambulatory Visit: Payer: Self-pay | Admitting: Family Medicine

## 2020-10-08 ENCOUNTER — Telehealth: Payer: Self-pay

## 2020-10-08 NOTE — Telephone Encounter (Signed)
Attempted to call pt. No answer, VM identifying. Left VM requesting call to schedule lab appointment when needed.   Lorita Officer, [place] 10/08/20 5:11 PM

## 2020-10-08 NOTE — Telephone Encounter (Signed)
Pt calling questioning if we can draw labs. Pt states she is ready to have gastric bypass but two labs are missing to be able to have surgery. Surgeon is requesting A1C and H. Pylori. Pt is requesting to have labs drawn here, is this possible? Please advise.   Debbie Stark 10/08/20 2:34 PM

## 2020-10-09 ENCOUNTER — Other Ambulatory Visit: Payer: BC Managed Care – PPO

## 2020-10-09 ENCOUNTER — Other Ambulatory Visit: Payer: Self-pay

## 2020-10-09 LAB — HEMOGLOBIN A1C
Est. average glucose Bld gHb Est-mCnc: 114 mg/dL
Hgb A1c MFr Bld: 5.6 % (ref 4.8–5.6)

## 2020-10-10 LAB — H. PYLORI BREATH TEST: H pylori Breath Test: NEGATIVE

## 2020-10-27 ENCOUNTER — Other Ambulatory Visit: Payer: Self-pay | Admitting: Physician Assistant

## 2020-10-27 DIAGNOSIS — I10 Essential (primary) hypertension: Secondary | ICD-10-CM

## 2020-11-11 ENCOUNTER — Telehealth: Payer: Self-pay

## 2020-11-11 ENCOUNTER — Ambulatory Visit: Payer: Self-pay | Admitting: Surgery

## 2020-11-11 NOTE — Telephone Encounter (Signed)
Debbie Stark called to report that she had to cancel her appointment that was scheduled for today.  She has surgery scheduled for Aug 30 and she has a pre-op appointment on August 17.  She plans to see [place] in September if nothing changes.  Dr. Sedalia Muta was made aware.

## 2020-11-12 ENCOUNTER — Ambulatory Visit: Payer: BC Managed Care – PPO | Admitting: Family Medicine

## 2020-11-24 ENCOUNTER — Encounter: Payer: BC Managed Care – PPO | Attending: Surgery | Admitting: Skilled Nursing Facility1

## 2020-11-24 ENCOUNTER — Other Ambulatory Visit: Payer: Self-pay

## 2020-11-24 DIAGNOSIS — E6609 Other obesity due to excess calories: Secondary | ICD-10-CM | POA: Diagnosis present

## 2020-11-24 DIAGNOSIS — Z6834 Body mass index (BMI) 34.0-34.9, adult: Secondary | ICD-10-CM | POA: Insufficient documentation

## 2020-11-25 NOTE — Progress Notes (Signed)
Pre-Operative Nutrition Class:    Patient was seen on 11/24/2020 for Pre-Operative Bariatric Surgery Education at [facility]    Surgery date: 12/09/2020 Surgery type: RYGB Start weight at [facility]: 255.7 pounds Weight today: 252.3 pounds  Samples given per MNT protocol. Patient educated on appropriate usage: Bariatric Advantage Multivitamin Lot # [id number] Exp: 08/23   Procare Calcium  Lot # [id number] Exp: 03/23   Bariatric Advantage protein powder Lot # [id number] Exp: 10/23  The following the learning objectives were met by the patient during this course: Identify Pre-Op Dietary Goals and will begin 2 weeks pre-operatively Identify appropriate sources of fluids and proteins  State protein recommendations and appropriate sources pre and post-operatively Identify Post-Operative Dietary Goals and will follow for 2 weeks post-operatively Identify appropriate multivitamin and calcium sources Describe the need for physical activity post-operatively and will follow MD recommendations State when to call healthcare provider regarding medication questions or post-operative complications When having a diagnosis of diabetes understanding hypoglycemia symptoms and the inclusion of 1 complex carbohydrate per meal  Handouts given during class include: Pre-Op Bariatric Surgery Diet Handout Protein Shake Handout Post-Op Bariatric Surgery Nutrition Handout BELT Program Information Flyer Support Group Information Flyer [facility] Bariatric Supplements Price List  Follow-Up Plan: Patient will follow-up at [facility] 2 weeks post operatively for diet advancement per MD.

## 2020-11-26 ENCOUNTER — Other Ambulatory Visit (HOSPITAL_COMMUNITY): Payer: Self-pay

## 2020-11-27 NOTE — Patient Instructions (Addendum)
DUE TO COVID-19 ONLY ONE VISITOR IS ALLOWED TO COME WITH YOU AND STAY IN THE WAITING ROOM ONLY DURING PRE OP AND PROCEDURE DAY OF SURGERY. THE 2 VISITORS  MAY VISIT WITH YOU AFTER SURGERY IN YOUR PRIVATE ROOM DURING VISITING HOURS ONLY!  YOU NEED TO HAVE A COVID 19 TEST ON_8/25_____ THIS TEST MUST BE DONE BEFORE SURGERY,                 Debbie Stark     Your procedure is scheduled on: 12/09/20   Report to [facility] Main  Entrance   Report to short stay at 5:15 AM     Call this number if you have problems the morning of surgery [phone]   BRUSH YOUR TEETH MORNING OF SURGERY AND RINSE YOUR MOUTH OUT, NO CHEWING GUM CANDY OR MINTS.  NO SOLID FOOD AFTER 6:00 PM THE NIGHT BEFORE YOUR SURGERY.   You may have clear liquids until 4:30 AM  Finish the G2 by 4:30 am  PAIN IS EXPECTED AFTER SURGERY AND WILL NOT BE COMPLETELY ELIMINATED.   AMBULATION AND TYLENOL WILL HELP REDUCE INCISIONAL AND GAS PAIN. MOVEMENT IS KEY!  YOU ARE EXPECTED TO BE OUT OF BED WITHIN 4 HOURS OF ADMISSION TO YOUR PATIENT ROOM.  SITTING IN THE RECLINER THROUGHOUT THE DAY IS IMPORTANT FOR DRINKING FLUIDS AND MOVING GAS THROUGHOUT THE GI TRACT.  COMPRESSION STOCKINGS SHOULD BE WORN [facility] STAY UNLESS YOU ARE WALKING.   INCENTIVE SPIROMETER SHOULD BE USED EVERY HOUR WHILE AWAKE TO DECREASE POST-OPERATIVE COMPLICATIONS SUCH AS PNEUMONIA.  WHEN DISCHARGED HOME, IT IS IMPORTANT TO CONTINUE TO WALK EVERY HOUR AND USE THE INCENTIVE SPIROMETER EVERY HOUR.      Take these medicines the morning of surgery with A SIP OF WATER:  Verapamil, Omeprazole Bring mask and tubing to the hospital.                               You may not have any metal on your body including hair pins and              piercings  Do not wear jewelry, make-up, lotions, powders or perfumes, deodorant             Do not wear nail polish on your fingernails.  Do not shave  48 hours prior to surgery.                  Do not bring valuables to the hospital. [place] IS NOT             RESPONSIBLE   FOR VALUABLES.  Contacts, dentures or bridgework may not be worn into surgery.                 Please read over the following fact sheets you were given: _____________________________________________________________________             [facility] - Preparing for Surgery Before surgery, you can play an important role.  Because skin is not sterile, your skin needs to be as free of germs as possible.  You can reduce the number of germs on your skin by washing with CHG (chlorahexidine gluconate) soap before surgery.  CHG is an antiseptic cleaner which kills germs and bonds with the skin to continue killing germs even after washing. Please DO NOT use if you have an allergy to CHG or antibacterial soaps.  If your  skin becomes reddened/irritated stop using the CHG and inform your nurse when you arrive at Short Stay. Do not shave (including legs and underarms) for at least 48 hours prior to the first CHG shower.   Please follow these instructions carefully:  1.  Shower with CHG Soap the night before surgery and the  morning of Surgery.  2.  If you choose to wash your hair, wash your hair first as usual with your  normal  shampoo.  3.  After you shampoo, rinse your hair and body thoroughly to remove the  shampoo.                            4.  Use CHG as you would any other liquid soap.  You can apply chg directly  to the skin and wash                       Gently with a scrungie or clean washcloth.  5.  Apply the CHG Soap to your body ONLY FROM THE NECK DOWN.   Do not use on face/ open                           Wound or open sores. Avoid contact with eyes, ears mouth and genitals (private parts).                       Wash face,  Genitals (private parts) with your normal soap.             6.  Wash thoroughly, paying special attention to the area where your surgery  will be performed.  7.  Thoroughly rinse your  body with warm water from the neck down.  8.  DO NOT shower/wash with your normal soap after using and rinsing off  the CHG Soap.             9.  Pat yourself dry with a clean towel.            10.  Wear clean pajamas.            11.  Place clean sheets on your bed the night of your first shower and do not  sleep with pets. Day of Surgery : Do not apply any lotions/deodorants the morning of surgery.  Please wear clean clothes to the hospital/surgery center.  FAILURE TO FOLLOW THESE INSTRUCTIONS MAY RESULT IN THE CANCELLATION OF YOUR SURGERY PATIENT SIGNATURE_________________________________  NURSE SIGNATURE__________________________________  ________________________________________________________________________   Debbie Stark  An incentive spirometer is a tool that can help keep your lungs clear and active. This tool measures how well you are filling your lungs with each breath. Taking long deep breaths may help reverse or decrease the chance of developing breathing (pulmonary) problems (especially infection) following: A long period of time when you are unable to move or be active. BEFORE THE PROCEDURE  If the spirometer includes an indicator to show your best effort, your nurse or respiratory therapist will set it to a desired goal. If possible, sit up straight or lean slightly forward. Try not to slouch. Hold the incentive spirometer in an upright position. INSTRUCTIONS FOR USE  Sit on the edge of your bed if possible, or sit up as far as you can in bed or on a chair. Hold the incentive spirometer in an upright position. Breathe out normally. Place  the mouthpiece in your mouth and seal your lips tightly around it. Breathe in slowly and as deeply as possible, raising the piston or the ball toward the top of the column. Hold your breath for 3-5 seconds or for as long as possible. Allow the piston or ball to fall to the bottom of the column. Remove the mouthpiece from your  mouth and breathe out normally. Rest for a few seconds and repeat Steps 1 through 7 at least 10 times every 1-2 hours when you are awake. Take your time and take a few normal breaths between deep breaths. The spirometer may include an indicator to show your best effort. Use the indicator as a goal to work toward during each repetition. After each set of 10 deep breaths, practice coughing to be sure your lungs are clear. If you have an incision (the cut made at the time of surgery), support your incision when coughing by placing a pillow or rolled up towels firmly against it. Once you are able to get out of bed, walk around indoors and cough well. You may stop using the incentive spirometer when instructed by your caregiver.  RISKS AND COMPLICATIONS Take your time so you do not get dizzy or light-headed. If you are in pain, you may need to take or ask for pain medication before doing incentive spirometry. It is harder to take a deep breath if you are having pain. AFTER USE Rest and breathe slowly and easily. It can be helpful to keep track of a log of your progress. Your caregiver can provide you with a simple table to help with this. If you are using the spirometer at home, follow these instructions: SEEK MEDICAL CARE IF:  You are having difficultly using the spirometer. You have trouble using the spirometer as often as instructed. Your pain medication is not giving enough relief while using the spirometer. You develop fever of 100.5 F (38.1 C) or higher. SEEK IMMEDIATE MEDICAL CARE IF:  You cough up bloody sputum that had not been present before. You develop fever of 102 F (38.9 C) or greater. You develop worsening pain at or near the incision site. MAKE SURE YOU:  Understand these instructions. Will watch your condition. Will get help right away if you are not doing well or get worse. Document Released: 08/09/2006 Document Revised: 06/21/2011 Document Reviewed: 10/10/2006 [facility]  Patient Information 2014 [place], [place].   ________________________________________________________________________

## 2020-11-28 ENCOUNTER — Other Ambulatory Visit: Payer: Self-pay

## 2020-11-28 ENCOUNTER — Encounter (HOSPITAL_COMMUNITY)
Admission: RE | Admit: 2020-11-28 | Discharge: 2020-11-28 | Disposition: A | Discharge status: Home / Self Care | Payer: BC Managed Care – PPO | Source: Ambulatory Visit | Attending: Surgery | Admitting: Surgery

## 2020-11-28 ENCOUNTER — Encounter (HOSPITAL_COMMUNITY): Payer: Self-pay

## 2020-11-28 DIAGNOSIS — Z01812 Encounter for preprocedural laboratory examination: Secondary | ICD-10-CM | POA: Insufficient documentation

## 2020-11-28 HISTORY — DX: Unspecified osteoarthritis, unspecified site: M19.90

## 2020-11-28 LAB — COMPREHENSIVE METABOLIC PANEL
ALT: 19 U/L (ref 0–44)
AST: 21 U/L (ref 15–41)
Albumin: 4.3 g/dL (ref 3.5–5.0)
Alkaline Phosphatase: 70 U/L (ref 38–126)
Anion gap: 8 (ref 5–15)
BUN: 23 mg/dL — ABNORMAL HIGH (ref 6–20)
CO2: 26 mmol/L (ref 22–32)
Calcium: 9.2 mg/dL (ref 8.9–10.3)
Chloride: 105 mmol/L (ref 98–111)
Creatinine, Ser: 1.05 mg/dL — ABNORMAL HIGH (ref 0.44–1.00)
GFR, Estimated: >60 mL/min (ref >60)
Glucose, Bld: 114 mg/dL — ABNORMAL HIGH (ref 70–99)
Potassium: 3.8 mmol/L (ref 3.5–5.1)
Sodium: 139 mmol/L (ref 135–145)
Total Bilirubin: 1.1 mg/dL (ref 0.3–1.2)
Total Protein: 7.9 g/dL (ref 6.5–8.1)

## 2020-11-28 LAB — CBC WITH DIFFERENTIAL/PLATELET
Abs Immature Granulocytes: 0.02 10*3/uL (ref 0.00–0.07)
Basophils Absolute: 0 10*3/uL (ref 0.0–0.1)
Basophils Relative: 0 %
Eosinophils Absolute: 0.3 10*3/uL (ref 0.0–0.5)
Eosinophils Relative: 4 %
HCT: 41.2 % (ref 36.0–46.0)
Hemoglobin: 13.3 g/dL (ref 12.0–15.0)
Immature Granulocytes: 0 %
Lymphocytes Relative: 29 %
Lymphs Abs: 2.2 10*3/uL (ref 0.7–4.0)
MCH: 30.6 pg (ref 26.0–34.0)
MCHC: 32.3 g/dL (ref 30.0–36.0)
MCV: 94.7 fL (ref 80.0–100.0)
Monocytes Absolute: 0.6 10*3/uL (ref 0.1–1.0)
Monocytes Relative: 8 %
Neutro Abs: 4.5 10*3/uL (ref 1.7–7.7)
Neutrophils Relative %: 59 %
Platelets: 214 10*3/uL (ref 150–400)
RBC: 4.35 MIL/uL (ref 3.87–5.11)
RDW: 12.2 % (ref 11.5–15.5)
WBC: 7.6 10*3/uL (ref 4.0–10.5)
nRBC: 0 % (ref 0.0–0.2)

## 2020-11-28 LAB — TYPE AND SCREEN
ABO/RH(D): A NEG
Antibody Screen: NEGATIVE

## 2020-11-28 NOTE — Progress Notes (Signed)
  Date COVID Vaccine 12/04/20   PCP - Dr. Kirtland Bouchard. [facility] LOV 07/31/20 Cardiologist - none  Chest x-ray - 07/31/20-epic EKG - 07/30/20-epic Stress Test - no ECHO - no Cardiac Cath - no Pacemaker/ICD device last checked:NA  Sleep Study - yes CPAP - yes  Fasting Blood Sugar - NA Checks Blood Sugar _____ times a day  Blood Thinner Instructions:NA Aspirin Instructions: Last Dose:  Anesthesia review: no  Patient denies shortness of breath, fever, cough and chest pain at PAT appointment Pt can climb 3 flights of stairs, do housework and ADLs without SOB  Patient verbalized understanding of instructions that were given to them at the PAT appointment. Patient was also instructed that they will need to review over the PAT instructions again at home before surgery. yes

## 2020-12-04 ENCOUNTER — Other Ambulatory Visit: Payer: Self-pay | Admitting: Surgery

## 2020-12-05 ENCOUNTER — Other Ambulatory Visit (HOSPITAL_COMMUNITY): Payer: Self-pay

## 2020-12-05 LAB — SARS CORONAVIRUS 2 (TAT 6-24 HRS): SARS Coronavirus 2: NEGATIVE

## 2020-12-08 MED ORDER — BUPIVACAINE LIPOSOME 1.3 % IJ SUSP
20.0000 mL | Freq: Once | INTRAMUSCULAR | Status: DC
  Filled 2020-12-08: qty 20

## 2020-12-08 NOTE — Anesthesia Preprocedure Evaluation (Addendum)
Anesthesia Evaluation  Patient identified by MRN, date of birth, ID band Patient awake    Reviewed: Allergy & Precautions, NPO status , Patient's Chart, lab work & pertinent test results  Airway Mallampati: II  TM Distance: >3 FB Neck ROM: Full    Dental no notable dental hx. (+) Dental Advisory Given   Pulmonary sleep apnea and Continuous Positive Airway Pressure Ventilation ,    Pulmonary exam normal breath sounds clear to auscultation       Cardiovascular hypertension, Pt. on medications Normal cardiovascular exam Rhythm:Regular Rate:Normal     Neuro/Psych  Headaches,    GI/Hepatic Neg liver ROS, GERD  ,  Endo/Other  Morbid obesity  Renal/GU negative Renal ROS     Musculoskeletal  (+) Arthritis ,   Abdominal (+) + obese,   Peds  Hematology negative hematology ROS (+)   Anesthesia Other Findings   Reproductive/Obstetrics negative OB ROS                            Anesthesia Physical Anesthesia Plan  ASA: 3  Anesthesia Plan: General   Post-op Pain Management:    Induction: Intravenous  PONV Risk Score and Plan: 4 or greater and Ondansetron, Aprepitant, Dexamethasone, Propofol infusion, Midazolam, Scopolamine patch - Pre-op and Treatment may vary due to age or medical condition  Airway Management Planned: Oral ETT  Additional Equipment: None  Intra-op Plan:   Post-operative Plan: Extubation in OR  Informed Consent: I have reviewed the patients History and Physical, chart, labs and discussed the procedure including the risks, benefits and alternatives for the proposed anesthesia with the patient or authorized representative who has indicated his/her understanding and acceptance.     Dental advisory given  Plan Discussed with: CRNA  Anesthesia Plan Comments:        Anesthesia Quick Evaluation

## 2020-12-09 ENCOUNTER — Other Ambulatory Visit (HOSPITAL_COMMUNITY): Payer: Self-pay

## 2020-12-09 ENCOUNTER — Encounter (HOSPITAL_COMMUNITY)
Admission: RE | Disposition: A | Discharge status: Home / Self Care | Payer: Self-pay | Source: Home / Self Care | Attending: Surgery

## 2020-12-09 ENCOUNTER — Inpatient Hospital Stay (HOSPITAL_COMMUNITY): Payer: BC Managed Care – PPO | Admitting: Certified Registered Nurse Anesthetist

## 2020-12-09 ENCOUNTER — Inpatient Hospital Stay (HOSPITAL_COMMUNITY)
Admission: RE | Admit: 2020-12-09 | Discharge: 2020-12-10 | DRG: 621 | Disposition: A | Discharge status: Home / Self Care | Payer: BC Managed Care – PPO | Attending: Surgery | Admitting: Surgery

## 2020-12-09 ENCOUNTER — Encounter (HOSPITAL_COMMUNITY): Payer: Self-pay | Admitting: Surgery

## 2020-12-09 ENCOUNTER — Other Ambulatory Visit: Payer: Self-pay

## 2020-12-09 DIAGNOSIS — G4730 Sleep apnea, unspecified: Secondary | ICD-10-CM | POA: Diagnosis present

## 2020-12-09 DIAGNOSIS — Z83438 Family history of other disorder of lipoprotein metabolism and other lipidemia: Secondary | ICD-10-CM

## 2020-12-09 DIAGNOSIS — E6609 Other obesity due to excess calories: Secondary | ICD-10-CM

## 2020-12-09 DIAGNOSIS — Z808 Family history of malignant neoplasm of other organs or systems: Secondary | ICD-10-CM | POA: Diagnosis not present

## 2020-12-09 DIAGNOSIS — Z8249 Family history of ischemic heart disease and other diseases of the circulatory system: Secondary | ICD-10-CM | POA: Diagnosis not present

## 2020-12-09 DIAGNOSIS — Z791 Long term (current) use of non-steroidal anti-inflammatories (NSAID): Secondary | ICD-10-CM | POA: Diagnosis not present

## 2020-12-09 DIAGNOSIS — Z833 Family history of diabetes mellitus: Secondary | ICD-10-CM

## 2020-12-09 DIAGNOSIS — Z79899 Other long term (current) drug therapy: Secondary | ICD-10-CM

## 2020-12-09 DIAGNOSIS — Z6841 Body Mass Index (BMI) 40.0 and over, adult: Secondary | ICD-10-CM

## 2020-12-09 DIAGNOSIS — Z823 Family history of stroke: Secondary | ICD-10-CM

## 2020-12-09 DIAGNOSIS — K219 Gastro-esophageal reflux disease without esophagitis: Secondary | ICD-10-CM | POA: Diagnosis present

## 2020-12-09 DIAGNOSIS — M1990 Unspecified osteoarthritis, unspecified site: Secondary | ICD-10-CM | POA: Diagnosis present

## 2020-12-09 DIAGNOSIS — I10 Essential (primary) hypertension: Secondary | ICD-10-CM | POA: Diagnosis present

## 2020-12-09 DIAGNOSIS — Z882 Allergy status to sulfonamides status: Secondary | ICD-10-CM

## 2020-12-09 HISTORY — PX: OTHER SURGICAL HISTORY: SHX169

## 2020-12-09 LAB — CBC
HCT: 36.3 % (ref 36.0–46.0)
Hemoglobin: 12 g/dL (ref 12.0–15.0)
MCH: 31 pg (ref 26.0–34.0)
MCHC: 33.1 g/dL (ref 30.0–36.0)
MCV: 93.8 fL (ref 80.0–100.0)
Platelets: 160 10*3/uL (ref 150–400)
RBC: 3.87 MIL/uL (ref 3.87–5.11)
RDW: 12.7 % (ref 11.5–15.5)
WBC: 9.3 10*3/uL (ref 4.0–10.5)
nRBC: 0 % (ref 0.0–0.2)

## 2020-12-09 LAB — CREATININE, SERUM
Creatinine, Ser: 1.26 mg/dL — ABNORMAL HIGH (ref 0.44–1.00)
GFR, Estimated: 51 mL/min — ABNORMAL LOW (ref >60)

## 2020-12-09 LAB — HEMOGLOBIN AND HEMATOCRIT, BLOOD
HCT: 36.7 % (ref 36.0–46.0)
Hemoglobin: 12 g/dL (ref 12.0–15.0)

## 2020-12-09 LAB — ABO/RH: ABO/RH(D): A NEG

## 2020-12-09 SURGERY — CREATION, GASTRIC BYPASS, ROUX-EN-Y, ROBOT-ASSISTED
Anesthesia: General | Site: Abdomen

## 2020-12-09 MED ORDER — OXYCODONE HCL 5 MG PO TABS
5.0000 mg | ORAL_TABLET | Freq: Four times a day (QID) | ORAL | 0 refills | Status: DC | PRN
  Filled 2020-12-09: qty 10, 3d supply, fill #0

## 2020-12-09 MED ORDER — CYCLOBENZAPRINE HCL 10 MG PO TABS: 10.0000 mg | ORAL_TABLET | Freq: Three times a day (TID) | ORAL | Status: DC | PRN

## 2020-12-09 MED ORDER — GLYCOPYRROLATE 0.2 MG/ML IJ SOLN
INTRAMUSCULAR | Status: DC | PRN
  Administered 2020-12-09: .1 mg via INTRAVENOUS

## 2020-12-09 MED ORDER — MEPERIDINE HCL 50 MG/ML IJ SOLN: 6.2500 mg | INTRAMUSCULAR | Status: DC | PRN

## 2020-12-09 MED ORDER — ONDANSETRON HCL 4 MG/2ML IJ SOLN
4.0000 mg | INTRAMUSCULAR | Status: DC | PRN
  Administered 2020-12-09 – 2020-12-10 (×2): 4 mg via INTRAVENOUS
  Filled 2020-12-09 (×2): qty 2

## 2020-12-09 MED ORDER — HYDROMORPHONE HCL 1 MG/ML IJ SOLN
INTRAMUSCULAR | Status: AC
  Administered 2020-12-09: 0.5 mg via INTRAVENOUS
  Filled 2020-12-09: qty 1

## 2020-12-09 MED ORDER — ATORVASTATIN CALCIUM 10 MG PO TABS
10.0000 mg | ORAL_TABLET | Freq: Every day | ORAL | Status: DC
  Administered 2020-12-09 – 2020-12-10 (×2): 10 mg via ORAL
  Filled 2020-12-09 (×2): qty 1

## 2020-12-09 MED ORDER — SCOPOLAMINE 1 MG/3DAYS TD PT72
1.0000 | MEDICATED_PATCH | TRANSDERMAL | Status: DC
  Administered 2020-12-09: 1.5 mg via TRANSDERMAL
  Filled 2020-12-09: qty 1

## 2020-12-09 MED ORDER — PROPOFOL 500 MG/50ML IV EMUL
INTRAVENOUS | Status: DC | PRN
  Administered 2020-12-09: 25 ug/kg/min via INTRAVENOUS

## 2020-12-09 MED ORDER — FENTANYL CITRATE (PF) 250 MCG/5ML IJ SOLN
INTRAMUSCULAR | Status: AC
  Filled 2020-12-09: qty 5

## 2020-12-09 MED ORDER — GABAPENTIN 300 MG PO CAPS
300.0000 mg | ORAL_CAPSULE | ORAL | Status: AC
Start: 2020-12-09 — End: 2020-12-09
  Administered 2020-12-09: 300 mg via ORAL
  Filled 2020-12-09: qty 1

## 2020-12-09 MED ORDER — GABAPENTIN 100 MG PO CAPS
200.0000 mg | ORAL_CAPSULE | Freq: Two times a day (BID) | ORAL | 0 refills | Status: DC
  Filled 2020-12-09: qty 20, 5d supply, fill #0

## 2020-12-09 MED ORDER — ORAL CARE MOUTH RINSE: 15.0000 mL | Freq: Once | OROMUCOSAL | Status: DC

## 2020-12-09 MED ORDER — CHLORHEXIDINE GLUCONATE CLOTH 2 % EX PADS: 6.0000 | MEDICATED_PAD | Freq: Once | CUTANEOUS | Status: DC

## 2020-12-09 MED ORDER — ROCURONIUM BROMIDE 10 MG/ML (PF) SYRINGE
PREFILLED_SYRINGE | INTRAVENOUS | Status: AC
  Filled 2020-12-09: qty 20

## 2020-12-09 MED ORDER — SUCCINYLCHOLINE CHLORIDE 200 MG/10ML IV SOSY
PREFILLED_SYRINGE | INTRAVENOUS | Status: AC
  Filled 2020-12-09: qty 10

## 2020-12-09 MED ORDER — MORPHINE SULFATE (PF) 2 MG/ML IV SOLN: 1.0000 mg | INTRAVENOUS | Status: DC | PRN

## 2020-12-09 MED ORDER — VERAPAMIL HCL ER 120 MG PO TBCR
120.0000 mg | EXTENDED_RELEASE_TABLET | Freq: Every day | ORAL | Status: DC
  Administered 2020-12-10: 120 mg via ORAL
  Filled 2020-12-09: qty 1

## 2020-12-09 MED ORDER — DEXAMETHASONE SODIUM PHOSPHATE 10 MG/ML IJ SOLN
INTRAMUSCULAR | Status: AC
  Filled 2020-12-09: qty 1

## 2020-12-09 MED ORDER — LACTATED RINGERS IR SOLN
Status: DC | PRN
  Administered 2020-12-09: 1000 mL

## 2020-12-09 MED ORDER — DEXAMETHASONE SODIUM PHOSPHATE 10 MG/ML IJ SOLN
INTRAMUSCULAR | Status: DC | PRN
Start: 2020-12-09 — End: 2020-12-09
  Administered 2020-12-09: 10 mg via INTRAVENOUS

## 2020-12-09 MED ORDER — CHLORHEXIDINE GLUCONATE 0.12 % MT SOLN: 15.0000 mL | Freq: Once | OROMUCOSAL | Status: DC

## 2020-12-09 MED ORDER — LIDOCAINE 2% (20 MG/ML) 5 ML SYRINGE
INTRAMUSCULAR | Status: AC
  Filled 2020-12-09: qty 5

## 2020-12-09 MED ORDER — SUGAMMADEX SODIUM 500 MG/5ML IV SOLN
INTRAVENOUS | Status: DC | PRN
  Administered 2020-12-09: 300 mg via INTRAVENOUS

## 2020-12-09 MED ORDER — ONDANSETRON 4 MG PO TBDP
4.0000 mg | ORAL_TABLET | Freq: Four times a day (QID) | ORAL | 0 refills | Status: DC | PRN
  Filled 2020-12-09: qty 18, 5d supply, fill #0

## 2020-12-09 MED ORDER — MIDAZOLAM HCL 5 MG/5ML IJ SOLN
INTRAMUSCULAR | Status: DC | PRN
  Administered 2020-12-09: 2 mg via INTRAVENOUS

## 2020-12-09 MED ORDER — SIMETHICONE 80 MG PO CHEW: 80.0000 mg | CHEWABLE_TABLET | Freq: Four times a day (QID) | ORAL | Status: DC | PRN

## 2020-12-09 MED ORDER — ACETAMINOPHEN 500 MG PO TABS
1000.0000 mg | ORAL_TABLET | ORAL | Status: AC
Start: 2020-12-09 — End: 2020-12-09
  Administered 2020-12-09: 1000 mg via ORAL
  Filled 2020-12-09: qty 2

## 2020-12-09 MED ORDER — BUPIVACAINE-EPINEPHRINE (PF) 0.25% -1:200000 IJ SOLN
INTRAMUSCULAR | Status: AC
  Filled 2020-12-09: qty 30

## 2020-12-09 MED ORDER — FENTANYL CITRATE (PF) 100 MCG/2ML IJ SOLN
INTRAMUSCULAR | Status: DC | PRN
  Administered 2020-12-09: 50 ug via INTRAVENOUS
  Administered 2020-12-09: 100 ug via INTRAVENOUS

## 2020-12-09 MED ORDER — ACETAMINOPHEN 160 MG/5ML PO SOLN
1000.0000 mg | Freq: Three times a day (TID) | ORAL | Status: DC
  Administered 2020-12-10: 1000 mg via ORAL
  Filled 2020-12-09: qty 40.6

## 2020-12-09 MED ORDER — LACTATED RINGERS IV SOLN: INTRAVENOUS | Status: DC

## 2020-12-09 MED ORDER — PROPOFOL 10 MG/ML IV BOLUS
INTRAVENOUS | Status: AC
  Filled 2020-12-09: qty 40

## 2020-12-09 MED ORDER — ENSURE MAX PROTEIN PO LIQD
2.0000 [oz_av] | ORAL | Status: DC
  Administered 2020-12-10 (×4): 2 [oz_av] via ORAL

## 2020-12-09 MED ORDER — PHENYLEPHRINE HCL-NACL 20-0.9 MG/250ML-% IV SOLN
INTRAVENOUS | Status: DC | PRN
  Administered 2020-12-09: 50 ug/min via INTRAVENOUS

## 2020-12-09 MED ORDER — ROCURONIUM BROMIDE 10 MG/ML (PF) SYRINGE
PREFILLED_SYRINGE | INTRAVENOUS | Status: DC | PRN
  Administered 2020-12-09: 30 mg via INTRAVENOUS
  Administered 2020-12-09: 70 mg via INTRAVENOUS
  Administered 2020-12-09: 30 mg via INTRAVENOUS

## 2020-12-09 MED ORDER — SODIUM CHLORIDE 0.9 % IV SOLN
2.0000 g | INTRAVENOUS | Status: AC
  Administered 2020-12-09: 2 g via INTRAVENOUS
  Filled 2020-12-09: qty 2

## 2020-12-09 MED ORDER — PHENYLEPHRINE HCL (PRESSORS) 10 MG/ML IV SOLN
INTRAVENOUS | Status: AC
  Filled 2020-12-09: qty 1

## 2020-12-09 MED ORDER — STERILE WATER FOR IRRIGATION IR SOLN
Status: DC | PRN
  Administered 2020-12-09: 2000 mL

## 2020-12-09 MED ORDER — MIDAZOLAM HCL 2 MG/2ML IJ SOLN
INTRAMUSCULAR | Status: AC
  Filled 2020-12-09: qty 2

## 2020-12-09 MED ORDER — ONDANSETRON HCL 4 MG/2ML IJ SOLN
INTRAMUSCULAR | Status: AC
  Filled 2020-12-09: qty 2

## 2020-12-09 MED ORDER — LIDOCAINE 2% (20 MG/ML) 5 ML SYRINGE
INTRAMUSCULAR | Status: DC | PRN
  Administered 2020-12-09: 100 mg via INTRAVENOUS

## 2020-12-09 MED ORDER — ONDANSETRON HCL 4 MG/2ML IJ SOLN
INTRAMUSCULAR | Status: DC | PRN
  Administered 2020-12-09: 4 mg via INTRAVENOUS

## 2020-12-09 MED ORDER — HYDROMORPHONE HCL 1 MG/ML IJ SOLN
0.2500 mg | INTRAMUSCULAR | Status: DC | PRN
  Administered 2020-12-09 (×2): 0.5 mg via INTRAVENOUS

## 2020-12-09 MED ORDER — 0.9 % SODIUM CHLORIDE (POUR BTL) OPTIME
TOPICAL | Status: DC | PRN
  Administered 2020-12-09: 1000 mL

## 2020-12-09 MED ORDER — ACETAMINOPHEN 500 MG PO TABS: 1000.0000 mg | ORAL_TABLET | Freq: Three times a day (TID) | ORAL | 0 refills | Status: AC

## 2020-12-09 MED ORDER — OXYCODONE HCL 5 MG/5ML PO SOLN
5.0000 mg | Freq: Four times a day (QID) | ORAL | Status: DC | PRN
  Filled 2020-12-09: qty 5

## 2020-12-09 MED ORDER — PANTOPRAZOLE SODIUM 40 MG IV SOLR
40.0000 mg | Freq: Every day | INTRAVENOUS | Status: DC
  Administered 2020-12-09: 40 mg via INTRAVENOUS
  Filled 2020-12-09: qty 40

## 2020-12-09 MED ORDER — ACETAMINOPHEN 500 MG PO TABS
1000.0000 mg | ORAL_TABLET | Freq: Three times a day (TID) | ORAL | Status: DC
  Administered 2020-12-09 – 2020-12-10 (×3): 1000 mg via ORAL
  Filled 2020-12-09 (×3): qty 2

## 2020-12-09 MED ORDER — PANTOPRAZOLE SODIUM 40 MG PO TBEC
40.0000 mg | DELAYED_RELEASE_TABLET | Freq: Every day | ORAL | 0 refills | Status: DC
  Filled 2020-12-09: qty 90, 90d supply, fill #0

## 2020-12-09 MED ORDER — BUPIVACAINE-EPINEPHRINE 0.25% -1:200000 IJ SOLN
INTRAMUSCULAR | Status: DC | PRN
  Administered 2020-12-09: 30 mL

## 2020-12-09 MED ORDER — RISAQUAD PO CAPS
1.0000 | ORAL_CAPSULE | Freq: Every day | ORAL | Status: DC
  Administered 2020-12-09 – 2020-12-10 (×2): 1 via ORAL
  Filled 2020-12-09 (×2): qty 1

## 2020-12-09 MED ORDER — PHENYLEPHRINE HCL (PRESSORS) 10 MG/ML IV SOLN
INTRAVENOUS | Status: AC
  Filled 2020-12-09: qty 2

## 2020-12-09 MED ORDER — ENOXAPARIN SODIUM 40 MG/0.4ML IJ SOSY
40.0000 mg | PREFILLED_SYRINGE | INTRAMUSCULAR | Status: AC
  Administered 2020-12-09: 40 mg via SUBCUTANEOUS
  Filled 2020-12-09: qty 0.4

## 2020-12-09 MED ORDER — LORATADINE 10 MG PO TABS
10.0000 mg | ORAL_TABLET | Freq: Every day | ORAL | Status: DC
  Administered 2020-12-09 – 2020-12-10 (×2): 10 mg via ORAL
  Filled 2020-12-09 (×2): qty 1

## 2020-12-09 MED ORDER — BUPIVACAINE LIPOSOME 1.3 % IJ SUSP
INTRAMUSCULAR | Status: DC | PRN
  Administered 2020-12-09: 20 mL

## 2020-12-09 MED ORDER — FLUTICASONE PROPIONATE 50 MCG/ACT NA SUSP
2.0000 | Freq: Every day | NASAL | Status: DC | PRN
  Filled 2020-12-09: qty 16

## 2020-12-09 MED ORDER — ENOXAPARIN SODIUM 30 MG/0.3ML IJ SOSY
30.0000 mg | PREFILLED_SYRINGE | Freq: Two times a day (BID) | INTRAMUSCULAR | Status: DC
  Administered 2020-12-09 – 2020-12-10 (×2): 30 mg via SUBCUTANEOUS
  Filled 2020-12-09 (×2): qty 0.3

## 2020-12-09 MED ORDER — PROMETHAZINE HCL 25 MG/ML IJ SOLN: 6.2500 mg | INTRAMUSCULAR | Status: DC | PRN

## 2020-12-09 MED ORDER — SUMATRIPTAN SUCCINATE 50 MG PO TABS
100.0000 mg | ORAL_TABLET | ORAL | Status: DC | PRN
  Filled 2020-12-09: qty 2

## 2020-12-09 MED ORDER — FAMOTIDINE 20 MG PO TABS
20.0000 mg | ORAL_TABLET | Freq: Every morning | ORAL | Status: DC
  Administered 2020-12-10: 20 mg via ORAL
  Filled 2020-12-09: qty 1

## 2020-12-09 MED ORDER — ROCURONIUM BROMIDE 10 MG/ML (PF) SYRINGE
PREFILLED_SYRINGE | INTRAVENOUS | Status: AC
  Filled 2020-12-09: qty 10

## 2020-12-09 MED ORDER — APREPITANT 40 MG PO CAPS
40.0000 mg | ORAL_CAPSULE | ORAL | Status: AC
  Administered 2020-12-09: 40 mg via ORAL
  Filled 2020-12-09: qty 1

## 2020-12-09 MED ORDER — PROPOFOL 10 MG/ML IV BOLUS
INTRAVENOUS | Status: DC | PRN
  Administered 2020-12-09: 200 mg via INTRAVENOUS

## 2020-12-09 SURGICAL SUPPLY — 81 items
ADH SKN CLS APL DERMABOND .7 (GAUZE/BANDAGES/DRESSINGS) ×1
APL PRP STRL LF DISP 70% ISPRP (MISCELLANEOUS) ×1
APPLIER CLIP 5 13 M/L LIGAMAX5 (MISCELLANEOUS)
APPLIER CLIP ROT 10 11.4 M/L (STAPLE)
APR CLP MED LRG 11.4X10 (STAPLE)
APR CLP MED LRG 5 ANG JAW (MISCELLANEOUS)
BAG COUNTER SPONGE SURGICOUNT (BAG) ×2 IMPLANT
BAG SPNG CNTER NS LX DISP (BAG) ×1
BLADE SURG SZ11 CARB STEEL (BLADE) ×2 IMPLANT
CANNULA REDUC XI 12-8 STAPL (CANNULA) ×2
CANNULA REDUCER 12-8 DVNC XI (CANNULA) ×1 IMPLANT
CHLORAPREP W/TINT 26 (MISCELLANEOUS) ×2 IMPLANT
CLIP APPLIE 5 13 M/L LIGAMAX5 (MISCELLANEOUS) IMPLANT
CLIP APPLIE ROT 10 11.4 M/L (STAPLE) IMPLANT
COVER SURGICAL LIGHT HANDLE (MISCELLANEOUS) ×2 IMPLANT
COVER TIP SHEARS 8 DVNC (MISCELLANEOUS) ×1 IMPLANT
COVER TIP SHEARS 8MM DA VINCI (MISCELLANEOUS) ×2
DECANTER SPIKE VIAL GLASS SM (MISCELLANEOUS) ×2 IMPLANT
DERMABOND ADVANCED (GAUZE/BANDAGES/DRESSINGS) ×1
DERMABOND ADVANCED .7 DNX12 (GAUZE/BANDAGES/DRESSINGS) ×1 IMPLANT
DRAPE ARM DVNC X/XI (DISPOSABLE) ×4 IMPLANT
DRAPE COLUMN DVNC XI (DISPOSABLE) ×1 IMPLANT
DRAPE DA VINCI XI ARM (DISPOSABLE) ×8
DRAPE DA VINCI XI COLUMN (DISPOSABLE) ×2
ELECT REM PT RETURN 15FT ADLT (MISCELLANEOUS) ×2 IMPLANT
ENDOLOOP SUT PDS II  0 18 (SUTURE)
ENDOLOOP SUT PDS II 0 18 (SUTURE) IMPLANT
GAUZE 4X4 16PLY ~~LOC~~+RFID DBL (SPONGE) ×2 IMPLANT
GLOVE SURG ENC MOIS LTX SZ7.5 (GLOVE) ×6 IMPLANT
GLOVE SURG UNDER LTX SZ8 (GLOVE) ×6 IMPLANT
GOWN STRL REUS W/TWL XL LVL3 (GOWN DISPOSABLE) ×6 IMPLANT
GRASPER SUT TROCAR 14GX15 (MISCELLANEOUS) ×2 IMPLANT
IRRIG SUCT STRYKERFLOW 2 WTIP (MISCELLANEOUS)
IRRIGATION SUCT STRKRFLW 2 WTP (MISCELLANEOUS) IMPLANT
KIT BASIN OR (CUSTOM PROCEDURE TRAY) ×2 IMPLANT
KIT GASTRIC LAVAGE 34FR ADT (SET/KITS/TRAYS/PACK) ×2 IMPLANT
LUBRICANT JELLY K Y 4OZ (MISCELLANEOUS) IMPLANT
MARKER SKIN DUAL TIP RULER LAB (MISCELLANEOUS) ×2 IMPLANT
MAT PREVALON FULL STRYKER (MISCELLANEOUS) ×2 IMPLANT
NEEDLE SPNL 18GX3.5 QUINCKE PK (NEEDLE) ×2 IMPLANT
NEEDLE SPNL 22GX3.5 QUINCKE BK (NEEDLE) ×2 IMPLANT
OBTURATOR OPTICAL STANDARD 8MM (TROCAR) ×2
OBTURATOR OPTICAL STND 8 DVNC (TROCAR) ×1
OBTURATOR OPTICALSTD 8 DVNC (TROCAR) ×1 IMPLANT
PACK CARDIOVASCULAR III (CUSTOM PROCEDURE TRAY) ×2 IMPLANT
RELOAD STAPLER 2.5X60 WHT DVNC (STAPLE) ×8 IMPLANT
RELOAD STAPLER 3.5X60 BLU DVNC (STAPLE) IMPLANT
SCISSORS LAP 5X45 EPIX DISP (ENDOMECHANICALS) IMPLANT
SEAL CANN UNIV 5-8 DVNC XI (MISCELLANEOUS) ×3 IMPLANT
SEAL XI 5MM-8MM UNIVERSAL (MISCELLANEOUS) ×6
SEALER SYNCHRO 8 IS4000 DV (MISCELLANEOUS) ×2
SEALER SYNCHRO 8 IS4000 DVNC (MISCELLANEOUS) ×1 IMPLANT
SEALER VESSEL DA VINCI XI (MISCELLANEOUS)
SEALER VESSEL EXT DVNC XI (MISCELLANEOUS) IMPLANT
SOL ANTI FOG 6CC (MISCELLANEOUS) ×1 IMPLANT
SOLUTION ANTI FOG 6CC (MISCELLANEOUS) ×1
SOLUTION ELECTROLUBE (MISCELLANEOUS) ×2 IMPLANT
STAPLER 60 DA VINCI SURE FORM (STAPLE) ×2
STAPLER 60 SUREFORM DVNC (STAPLE) ×1 IMPLANT
STAPLER CANNULA SEAL DVNC XI (STAPLE) ×1 IMPLANT
STAPLER CANNULA SEAL XI (STAPLE) ×2
STAPLER RELOAD 2.5X60 WHITE (STAPLE) ×16
STAPLER RELOAD 2.5X60 WHT DVNC (STAPLE) ×8
STAPLER RELOAD 3.5X60 BLU DVNC (STAPLE)
STAPLER RELOAD 3.5X60 BLUE (STAPLE)
SUT ETHIBOND 0 (SUTURE) ×6 IMPLANT
SUT MNCRL AB 4-0 PS2 18 (SUTURE) ×2 IMPLANT
SUT V-LOC BARB 180 2/0GR6 GS22 (SUTURE) ×2
SUT VIC AB 2-0 SH 27 (SUTURE) ×2
SUT VIC AB 2-0 SH 27XBRD (SUTURE) ×1 IMPLANT
SUT VICRYL 0 TIES 12 18 (SUTURE) ×2 IMPLANT
SUT VLOC BARB 180 ABS3/0GR12 (SUTURE) ×4
SUTURE V-LC BRB 180 2/0GR6GS22 (SUTURE) ×1 IMPLANT
SUTURE VLOC BRB 180 ABS3/0GR12 (SUTURE) ×2 IMPLANT
SYR 20ML LL LF (SYRINGE) ×2 IMPLANT
TOWEL OR 17X26 10 PK STRL BLUE (TOWEL DISPOSABLE) ×2 IMPLANT
TRAY FOLEY MTR SLVR 16FR STAT (SET/KITS/TRAYS/PACK) IMPLANT
TROCAR ADV FIXATION 12X100MM (TROCAR) ×2 IMPLANT
TROCAR Z-THREAD FIOS 5X100MM (TROCAR) ×2 IMPLANT
TUBE CALIBRATION LAPBAND (TUBING) IMPLANT
TUBING INSUFFLATION 10FT LAP (TUBING) ×2 IMPLANT

## 2020-12-09 NOTE — Op Note (Addendum)
Patient: Debbie Stark (1965-06-30, [id number])  Date of Surgery: 12/09/2020   Preoperative Diagnosis: morbid obesity, BMI 42  Postoperative Diagnosis: morbid obesity, BMI 42  Surgical Procedure: ROBOTIC GASTRIC BYPASS RNY, UPPER GASTROINESTINAL ENDOSCOPY:    Operative Team Members:  Surgeon(s) and Role:    * Enrika Aguado, Hyman Hopes, MD - Primary  Phylliss Blakes, MD - assistant  Anesthesiologist: Lewie Loron, MD CRNA: Ezekiel Ina, CRNA; Ponciano Ort, CRNA   Anesthesia: General   Fluids:  Total I/O In: 100 [IV Piggyback:100] Out: 50 [Blood:50]  Complications: None  Drains:  none   Specimen: None  Disposition:  PACU - hemodynamically stable.  Plan of Care: Admit to inpatient     Indications for Procedure:  Ms. Tilly is a 55 year old female with Morbid Obesity BMI 42.43, sleep apnea, and GERD, who presents for elective robotic gastric bypass.  Initially we discussed the bariatric procedures offered as well as their risks, benefits and alternatives in detail with the patient. I entered her information into the [facility] bariatric surgery risk/benefit calculator to facilitate this discussion. After full discussion all questions answered, she decided to pursue a gastric bypass due to her concerns about reflux. She completed the preoperative bariatric surgery pathway to include the following:   - Dietitian consultation - Bloodwork - Chest x-ray and EKG - Obtaining a CPAP as she had a recent positive sleep study - Psychology consultation - H. Pylori breath test negative - Referral to gastroenterology for colonoscopy and EGD.  EGD normal.   Again, we discussed the risks, benefits and alternatives of a robotic sleeve gastrectomy.  After a full discussion and all questions answered, the patient granted consent to proceed.  We will proceed as scheduled.  Findings: Normal anatomy  Infection status: Patient: Private Patient Elective Case Case: Elective Infection Present  At Time Of Surgery (PATOS): Some spillage of foregut  and jejunal contents while creating anastomoses   Description of Procedure:   On the date stated above, the patient was taken to the operating room suite and placed in supine positioning.  General endotracheal anesthesia was induced.  A timeout was completed verifying the correct patient, procedure, positioning and equipment needed for the case.  The patient's abdomen was prepped and draped in the usual sterile fashion.  A 5 mm trocar was inserted in the RUQ using the optical technique.  There was no trauma to the underlying viscera with initial trocar placement.  Four robotic trocars were placed across the abdomen at this level.  The robotic stapling trocar was placed in the right most position where the initial 83mm access was placed.  A 12 mm balloon assistant trocar was placed in the left lower quadrant tunneling through the rectus muscle.  The [facility] liver retractor was placed through the subxiphoid region and under the left lobe of the liver and was connected to the rail of the bed.  A TAP block was placed using marcaine and Exparel under direct vision of the laparoscope.  The [employer] XI robotic platform was docked and we transitioned to robotic surgery.  Using the tip up grasper, fenestrated bipolar, 30 degree camera and Synchroseal from the patient's right to left, we began by dissecting the angle of His off the left crus of the diaphragm.  The adhesions between the stomach, spleen and diaphragm were divided using the Synchroseal to define the angle of His.  I then started 4-6 cm down on the lesser curve of the stomach and created a defect in  the gastrohepatic ligament tracking behind the lesser curve of the stomach to enter the lesser sac.  I then used multiple white loads of the robotic 60 mm Sureform linear stapler to form the gastric pouch.  I then directed my attention to the lower abdomen.  The omentum was divided with the Synchroseal and  I identified the ligament of treitz.  The jejunum was run to a point 50 cm from the ligament of Treitz.  This loop of bowel was then brought into the left upper quadrant, over the transverse colon, between the split omentum.  A 2-0 v-loc suture was used to create the posterior outer row of the gastrojejunal anastomosis.  A window was made in the jejunal mesentery and the jejunum was divided just proximal to the gastrojejunal anastomosis using a white load of the robotic 60 mm Sureform linear stapler to divide the roux limb from the hepatobiliary limb.  I then continued to create the gastrojejunal anastomosis.  An approximately 2 cm gastrotomy was made in the pouch and a matching 2 cm enterotomy was created in the roux limb.  Then, 3-0 v-loc was used to create a posterior, inner, full thickness layer of the anastomosis.  A second 3-0 v-loc was used to create an anterior, inner, full thickness layer of the anastomosis.  While sewing the anterior inner layer, the Ewald tube was passed through the anastomosis to ensure patency.  The outer, anterior layer was then created using 2-0 v-loc suture.  At this point the gastrojejunal anastomosis was complete and the Ewald tube was removed.  The roux limb was then run 100 cm from the gastrojejunal anastomosis.  This loop of bowel was brought into the left upper quadrant for anastomosis to the hepatobiliary limb.  Enterotomies were created in both limbs using the monopolar scissors.  A robotic 60 mm white load on the Sureform linear stapler was introduced into both limbs and fired to create the jejunojejunostomy.  A second white load of the stapler was fired with one limb in the anastamosis and one limb in the distal roux-limb, creating a "W" shaped anastomosis to avoid stenosis.  The anastomosis was inspected, then the common enterotomy was closed using a third white load of the stapler.  The jejunojejunostomy mesenteric defect was closed using running 0-Ethibond suture.    The retro-roux defect was closed, closing the roux limb mesentery to the transverse mesocolon mesentery using a running 0-Ethibond suture.   I ran the roux limb from the gastrojejunal anastomosis to the jejunojejunostomy and found the anatomy as expected without any twisting of the mesentery.  The tip-up grasper was used to occlude the proximal roux limb.  I then transitioned to endoscopic portion of the procedure.  The adult upper endoscope was inserted into the pouch.  The pouch appeared appropriately sized.  There was good intra-luminal hemostasis.  The endoscope was inserted through the anastomosis into the roux limb.  The anastomosis appeared well formed without any stricture.  The anastomosis was submerged in irrigation in the left upper quadrant and there was no leakage of air bubbles with endoscopic insufflation suggesting a negative leak test and an air tight anastomosis.    The foregut was decompressed with the endoscope and the endoscope was removed.  The small bowel was released by the tip-up grasper and then the robotic instruments were removed and the robot was undocked.  The stapler port in the right abdomen was closed at the fascial level using 0-vicryl on a PMI suture passer.  The 12 mm assistant port was not closed as it was tunneled through the abdominal wall.  The liver retractor was removed under direct vision.  The pneumoperitoneum was evacuated.  The skin was closed using 4-0 Monocryl and Dermabond.  All sponge and needle counts were correct at the end of the case.    Ivar Drape, MD General, Bariatric, & Minimally Invasive Surgery [facility], [place]

## 2020-12-09 NOTE — Anesthesia Postprocedure Evaluation (Signed)
Anesthesia Post Note  Patient: Debbie Stark  Procedure(s) Performed: ROBOTIC GASTRIC BYPASS RNY, UPPER GASTROINESTINAL ENDOSCOPY (Abdomen)     Patient location during evaluation: PACU Anesthesia Type: General Level of consciousness: sedated and patient cooperative Pain management: pain level controlled Vital Signs Assessment: post-procedure vital signs reviewed and stable Respiratory status: spontaneous breathing Cardiovascular status: stable Anesthetic complications: no   No notable events documented.  Last Vitals:  Vitals:   12/09/20 1246 12/09/20 1336  BP: 129/71 132/73  Pulse: (!) 46 (!) 44  Resp: 18 18  Temp: 36.4 C 36.6 C  SpO2: 100% 100%    Last Pain:  Vitals:   12/09/20 1336  TempSrc: Oral  PainSc:                  Lewie Loron

## 2020-12-09 NOTE — Progress Notes (Signed)
Patient has been using her incentive spirometer, has ambulated to the chair, her vitals are stable. Patient started drinking first 2oz cup of water at 1250.

## 2020-12-09 NOTE — H&P (Addendum)
Admitting Physician: Hyman Hopes Qiana Landgrebe  Service: Bariatric Surgery  CC: Morbid Obesity  Subjective   HPI: Debbie Stark is an 55 y.o. female who is here for elective robotic roux-en-y gastric bypass  Past Medical History:  Diagnosis Date   Arthritis    lower back pain   GERD (gastroesophageal reflux disease)    Hypertension    IBS (irritable bowel syndrome)    with constipation   Migraine with aura, intractable, without status migrainosus    Sleep apnea 07/2020   C-Pap    Past Surgical History:  Procedure Laterality Date   tummy tuck      Family History  Problem Relation Age of Onset   Cancer Mother        endometrial   Diabetes Father    Hyperlipidemia Father    Hypertension Father    Heart disease Father    Stroke Father     Social:  reports that she has never smoked. She has never used smokeless tobacco. She reports that she does not currently use alcohol. She reports that she does not use drugs.  Allergies:  Allergies  Allergen Reactions   Sulfa Antibiotics Rash    Medications: Current Outpatient Medications  Medication Instructions   acidophilus (RISAQUAD) CAPS capsule 1 capsule, Oral, Daily   atorvastatin (LIPITOR) 10 MG tablet TAKE 1 TABLET(10 MG) BY MOUTH DAILY   cetirizine (ZYRTEC) 10 mg, Oral, Daily PRN   cyclobenzaprine (FLEXERIL) 10 mg, Oral, 3 times daily PRN   famotidine (PEPCID) 20 MG tablet TAKE 1 TABLET(20 MG) BY MOUTH TWICE DAILY   fluticasone (FLONASE) 50 MCG/ACT nasal spray 2 sprays, Each Nare, Daily   meloxicam (MOBIC) 7.5 mg, Oral, Daily   meloxicam (MOBIC) 7.5 mg, Daily PRN   omeprazole (PRILOSEC) 40 mg, Oral, Daily   promethazine (PHENERGAN) 25 mg, Oral, Every 6 hours PRN   rizatriptan (MAXALT) 10 MG tablet Take one at onset of migraine/aura, repeat in 2 hours if not resolved. NO more than 2 in 24 hours.   verapamil (CALAN-SR) 240 MG CR tablet TAKE 1 TABLET BY MOUTH DAILY    ROS - all of the below systems have been  reviewed with the patient and positives are indicated with bold text General: chills, fever or night sweats Eyes: blurry vision or double vision ENT: epistaxis or sore throat Allergy/Immunology: itchy/watery eyes or nasal congestion Hematologic/Lymphatic: bleeding problems, blood clots or swollen lymph nodes Endocrine: temperature intolerance or unexpected weight changes Breast: new or changing breast lumps or nipple discharge Resp: cough, shortness of breath, or wheezing CV: chest pain or dyspnea on exertion GI: as per HPI GU: dysuria, trouble voiding, or hematuria MSK: joint pain or joint stiffness Neuro: TIA or stroke symptoms Derm: pruritus and skin lesion changes Psych: anxiety and depression  Objective   PE Blood pressure (!) 148/72, pulse 76, temperature 98.4 F (36.9 C), temperature source Oral, resp. rate 16, height 5\' 4"  (1.626 m), weight 112.1 kg, SpO2 96 %. Constitutional: NAD; conversant; no deformities Eyes: Moist conjunctiva; no lid lag; anicteric; PERRL Neck: Trachea midline; no thyromegaly Lungs: Normal respiratory effort; no tactile fremitus CV: RRR; no palpable thrills; no pitting edema GI: Abd Soft, non-tender; no palpable hepatosplenomegaly MSK: Normal range of motion of extremities; no clubbing/cyanosis Psychiatric: Appropriate affect; alert and oriented x3 Lymphatic: No palpable cervical or axillary lymphadenopathy  Results for orders placed or performed during the hospital encounter of 12/09/20 (from the past 24 hour(s))  ABO/Rh     Status: None  Collection Time: 12/09/20  6:33 AM  Result Value Ref Range   ABO/RH(D)      A NEG Performed at [facility], [address] [place], [place] [address]     Imaging Orders  No imaging studies ordered today     Assessment and Plan   Debbie Stark is a 55 year old female with Morbid Obesity BMI 42.43, sleep apnea, and GERD, who presents for elective robotic gastric bypass.  Initially we  discussed the bariatric procedures offered as well as their risks, benefits and alternatives in detail with the patient. I entered her information into the [facility] bariatric surgery risk/benefit calculator to facilitate this discussion. After full discussion all questions answered, she decided to pursue a gastric bypass due to her concerns about reflux. She completed the preoperative bariatric surgery pathway to include the following:  - Dietitian consultation - Bloodwork - Chest x-ray and EKG - Obtaining a CPAP as she had a recent positive sleep study - Psychology consultation - H. Pylori breath test negative - Referral to gastroenterology for colonoscopy and EGD.  EGD normal.  Again, we discussed the risks, benefits and alternatives of a robotic sleeve gastrectomy.  After a full discussion and all questions answered, the patient granted consent to proceed.  We will proceed as scheduled.  Quentin Ore, MD  [facility] Use [email or web address] to contact on call provider

## 2020-12-09 NOTE — Anesthesia Procedure Notes (Signed)
Procedure Name: Intubation Date/Time: 12/09/2020 7:36 AM Performed by: Cleda Daub, CRNA Pre-anesthesia Checklist: Patient identified, Emergency Drugs available, Suction available and Patient being monitored Patient Re-evaluated:Patient Re-evaluated prior to induction Oxygen Delivery Method: Circle system utilized Preoxygenation: Pre-oxygenation with 100% oxygen Induction Type: IV induction Ventilation: Mask ventilation without difficulty Laryngoscope Size: Mac and 3 Grade View: Grade I Tube type: Oral Tube size: 7.0 mm Number of attempts: 1 Airway Equipment and Method: Stylet and Oral airway Placement Confirmation: ETT inserted through vocal cords under direct vision, positive ETCO2 and breath sounds checked- equal and bilateral Secured at: 21 cm Tube secured with: Tape Dental Injury: Teeth and Oropharynx as per pre-operative assessment

## 2020-12-09 NOTE — Progress Notes (Signed)
Patient is too lethargic from anesthesia to ambulate at this time.

## 2020-12-09 NOTE — Transfer of Care (Signed)
Immediate Anesthesia Transfer of Care Note  Patient: Debbie Stark  Procedure(s) Performed: ROBOTIC GASTRIC BYPASS RNY, UPPER GASTROINESTINAL ENDOSCOPY (Abdomen)  Patient Location: PACU  Anesthesia Type:General  Level of Consciousness: drowsy  Airway & Oxygen Therapy: Patient Spontanous Breathing and Patient connected to face mask oxygen  Post-op Assessment: Report given to RN and Post -op Vital signs reviewed and stable  Post vital signs: Reviewed and stable  Last Vitals:  Vitals Value Taken Time  BP 144/83 12/09/20 1017  Temp    Pulse 66 12/09/20 1019  Resp 18 12/09/20 1019  SpO2 95 % 12/09/20 1019  Vitals shown include unvalidated device data.  Last Pain:  Vitals:   12/09/20 0643  TempSrc:   PainSc: 0-No pain      Patients Stated Pain Goal: 3 (12/09/20 6226)  Complications: No notable events documented.

## 2020-12-09 NOTE — Progress Notes (Signed)
Discussed QI "Goals for Discharge" document with patient and husband including ambulation in halls, Incentive Spirometry use every hour, and oral care.  Also discussed pain and nausea control.  Enabled or verified head of bed 30 degree alarm activated.  BSTOP education provided including BSTOP information guide, "Guide for Pain Management after your Bariatric Procedure".  Diet progression education provided including "Bariatric Surgery Post-Op Food Plan Phase 1: Liquids".  Questions answered.  Will continue to partner with bedside RN and follow up with patient per protocol.

## 2020-12-09 NOTE — Progress Notes (Signed)
PHARMACY CONSULT FOR:  Risk Assessment for Post-Discharge VTE Following Bariatric Surgery  Post-Discharge VTE Risk Assessment: This patient's probability of 30-day post-discharge VTE is increased due to the factors marked:   Female    Age >/=60 years    BMI >/=50 kg/m2    CHF    Dyspnea at Rest    Paraplegia  x  Non-gastric-band surgery    Operation Time >/=3 hr    Return to OR     Length of Stay >/= 3 d   Hx of VTE   Hypercoagulable condition   Significant venous stasis       Predicted probability of 30-day post-discharge VTE: 0.16%  Other patient-specific factors to consider:   Recommendation for Discharge: No pharmacologic prophylaxis post-discharge    Debbie Stark is a 55 y.o. female who underwent laparoscopic Roux-en-Y gastric bypass on 8/30   Stark start: 0754 Stark end: 1008   Allergies  Allergen Reactions   Sulfa Antibiotics Rash    Patient Measurements: Height: 5\' 4"  (162.6 cm) Weight: 112.1 kg (247 lb 3.2 oz) IBW/kg (Calculated) : 54.7 Body mass index is 42.43 kg/m.  Recent Labs    12/09/20 1042  WBC 9.3  HGB 12.0  12.0  HCT 36.3  36.7  PLT 160  CREATININE 1.26*   Estimated Creatinine Clearance: 64.1 mL/min (A) (by C-G formula based on SCr of 1.26 mg/dL (H)).    Past Medical History:  Diagnosis Date   Arthritis    lower back pain   GERD (gastroesophageal reflux disease)    Hypertension    IBS (irritable bowel syndrome)    with constipation   Migraine with aura, intractable, without status migrainosus    Sleep apnea 07/2020   C-Pap     Medications Prior to Admission  Medication Sig Dispense Refill Last Dose   acidophilus (RISAQUAD) CAPS capsule Take 1 capsule by mouth daily.   Past Week   atorvastatin (LIPITOR) 10 MG tablet TAKE 1 TABLET(10 MG) BY MOUTH DAILY 90 tablet 0 Past Week   cetirizine (ZYRTEC) 10 MG tablet Take 10 mg by mouth daily as needed for allergies.   Past Week   cyclobenzaprine (FLEXERIL) 10 MG tablet Take 10  mg by mouth 3 (three) times daily as needed for muscle spasms.      famotidine (PEPCID) 20 MG tablet TAKE 1 TABLET(20 MG) BY MOUTH TWICE DAILY (Patient taking differently: Take 20 mg by mouth in the morning.) 180 tablet 3    fluticasone (FLONASE) 50 MCG/ACT nasal spray Place 2 sprays into both nostrils daily. (Patient taking differently: Place 2 sprays into both nostrils daily as needed for allergies.) 16 g 6    omeprazole (PRILOSEC) 40 MG capsule Take 1 capsule (40 mg total) by mouth daily. 90 capsule 3 12/09/2020   promethazine (PHENERGAN) 25 MG tablet Take 25 mg by mouth every 6 (six) hours as needed for vomiting.      rizatriptan (MAXALT) 10 MG tablet Take one at onset of migraine/aura, repeat in 2 hours if not resolved. NO more than 2 in 24 hours. 90 tablet 1 Past Month   verapamil (CALAN-SR) 240 MG CR tablet TAKE 1 TABLET BY MOUTH DAILY 90 tablet 0 12/09/2020   meloxicam (MOBIC) 15 MG tablet Take 7.5 mg by mouth daily as needed for pain. (Patient not taking: Reported on 11/26/2020)   Not Taking   meloxicam (MOBIC) 7.5 MG tablet Take 1 tablet (7.5 mg total) by mouth daily. (Patient not taking: No sig reported) 30  tablet 0 Not Taking       Berkley Harvey 12/09/2020,12:04 PM

## 2020-12-10 ENCOUNTER — Other Ambulatory Visit (HOSPITAL_COMMUNITY): Payer: Self-pay

## 2020-12-10 LAB — CBC WITH DIFFERENTIAL/PLATELET
Abs Immature Granulocytes: 0.08 10*3/uL — ABNORMAL HIGH (ref 0.00–0.07)
Basophils Absolute: 0 10*3/uL (ref 0.0–0.1)
Basophils Relative: 0 %
Eosinophils Absolute: 0 10*3/uL (ref 0.0–0.5)
Eosinophils Relative: 0 %
HCT: 35.4 % — ABNORMAL LOW (ref 36.0–46.0)
Hemoglobin: 11.9 g/dL — ABNORMAL LOW (ref 12.0–15.0)
Immature Granulocytes: 1 %
Lymphocytes Relative: 11 %
Lymphs Abs: 1 10*3/uL (ref 0.7–4.0)
MCH: 30.9 pg (ref 26.0–34.0)
MCHC: 33.6 g/dL (ref 30.0–36.0)
MCV: 91.9 fL (ref 80.0–100.0)
Monocytes Absolute: 0.3 10*3/uL (ref 0.1–1.0)
Monocytes Relative: 3 %
Neutro Abs: 7.3 10*3/uL (ref 1.7–7.7)
Neutrophils Relative %: 85 %
Platelets: 133 10*3/uL — ABNORMAL LOW (ref 150–400)
RBC: 3.85 MIL/uL — ABNORMAL LOW (ref 3.87–5.11)
RDW: 12.7 % (ref 11.5–15.5)
WBC: 8.6 10*3/uL (ref 4.0–10.5)
nRBC: 0 % (ref 0.0–0.2)

## 2020-12-10 NOTE — Plan of Care (Signed)

## 2020-12-10 NOTE — Progress Notes (Signed)
Patient alert and oriented, Post op day 1.  Provided support and encouragement.  Encouraged pulmonary toilet, ambulation and small sips of liquids.  All questions answered.  Will continue to monitor. 

## 2020-12-10 NOTE — Progress Notes (Signed)
Patient alert and oriented, pain is controlled. Patient is tolerating fluids, advanced to protein shake today, patient is tolerating well. Reviewed Gastric Bypass discharge instructions with patient and patient is able to articulate understanding. Provided information on BELT program, Support Group and [facility] outpatient pharmacy. All questions answered, will continue to monitor.    

## 2020-12-10 NOTE — Progress Notes (Signed)
Nutrition Education Note ° °Received consult for diet education for patient s/p bariatric surgery. ° °Discussed 2 week post op diet with pt. Emphasized that liquids must be non carbonated, non caffeinated, and sugar free. Fluid goals discussed. Pt to follow up with outpatient bariatric RD for further diet progression after 2 weeks. Multivitamins and minerals also reviewed. Teach back method used, pt expressed understanding, expect good compliance. ° °If nutrition issues arise, please consult RD. ° °Debbie Kindall, MS, RD, LDN °Inpatient Clinical Dietitian °Contact information available via Amion ° ° °

## 2020-12-10 NOTE — Discharge Instructions (Signed)

## 2020-12-10 NOTE — Progress Notes (Signed)
Progress Note: General Surgery Service   Chief Complaint/Subjective: Doing well POD1.  Some nausea/pain as expected.  Objective: Vital signs in last 24 hours: Temp:  [97.5 F (36.4 C)-98.1 F (36.7 C)] 98.1 F (36.7 C) (08/31 0533) Pulse Rate:  [44-71] 47 (08/31 0533) Resp:  [14-21] 14 (08/31 0533) BP: (121-154)/(68-96) 154/73 (08/31 0533) SpO2:  [92 %-100 %] 96 % (08/31 0533) Last BM Date: 12/08/20  Intake/Output from previous day: 08/30 0701 - 08/31 0700 In: 2901.5 [P.O.:510; I.V.:2291.5; IV Piggyback:100] Out: 1500 [Urine:1450; Blood:50] Intake/Output this shift: Total I/O In: 236.3 [I.V.:236.3] Out: -   Constitutional: NAD; conversant; no deformities Eyes: Moist conjunctiva; no lid lag; anicteric; PERRL Neck: Trachea midline; no thyromegaly Lungs: Normal respiratory effort; no tactile fremitus CV: RRR; no palpable thrills; no pitting edema GI: Abd Soft, non-tender; no palpable hepatosplenomegaly MSK: Normal range of motion of extremities; no clubbing/cyanosis Psychiatric: Appropriate affect; alert and oriented x3 Lymphatic: No palpable cervical or axillary lymphadenopathy  Lab Results: CBC  Recent Labs    12/09/20 1042 12/10/20 0429  WBC 9.3 8.6  HGB 12.0  12.0 11.9*  HCT 36.3  36.7 35.4*  PLT 160 133*   BMET Recent Labs    12/09/20 1042  CREATININE 1.26*   PT/INR No results for input(s): LABPROT, INR in the last 72 hours. ABG No results for input(s): PHART, HCO3 in the last 72 hours.  Invalid input(s): PCO2, PO2  Anti-infectives: Anti-infectives (From admission, onward)    Start     Dose/Rate Route Frequency Ordered Stop   12/09/20 0630  cefoTEtan (CEFOTAN) 2 g in sodium chloride 0.9 % 100 mL IVPB        2 g 200 mL/hr over 30 Minutes Intravenous On call to O.R. 12/09/20 6213 12/09/20 1019       Medications: Scheduled Meds:  acetaminophen  1,000 mg Oral Q8H   Or   acetaminophen (TYLENOL) oral liquid 160 mg/5 mL  1,000 mg Oral Q8H    acidophilus  1 capsule Oral Daily   atorvastatin  10 mg Oral Daily   enoxaparin (LOVENOX) injection  30 mg Subcutaneous Q12H   famotidine  20 mg Oral q AM   loratadine  10 mg Oral Daily   pantoprazole (PROTONIX) IV  40 mg Intravenous QHS   Ensure Max Protein  2 oz Oral Q2H   verapamil  120 mg Oral Daily   Continuous Infusions:  lactated ringers 75 mL/hr at 12/10/20 0102   PRN Meds:.cyclobenzaprine, fluticasone, morphine injection, ondansetron (ZOFRAN) IV, oxyCODONE, simethicone, SUMAtriptan  Assessment/Plan: s/p Procedure(s): ROBOTIC GASTRIC BYPASS RNY, UPPER GASTROINESTINAL ENDOSCOPY 12/09/2020  Doing well POD 1 Continue per protocol Possibly home later today   LOS: 1 day     Quentin Ore, MD  [facility] Use [email or web address] to contact on call provider

## 2020-12-10 NOTE — Discharge Summary (Signed)
Patient ID: Debbie Stark [id number] 55 y.o. 12-Sep-1965  12/09/2020  Discharge date and time: 12/11/2020  Admitting Physician: Hyman Hopes Rondi Ivy  Discharge Physician: Hyman Hopes Vipul Cafarelli  Admission Diagnoses: Morbid obesity with BMI of 40.0-44.9, adult (HCC) [E66.01, Z68.41] Patient Active Problem List   Diagnosis Date Noted   Morbid obesity with BMI of 40.0-44.9, adult (HCC) 12/09/2020   Generalized abdominal pain 10/29/2019   Class 1 obesity due to excess calories with serious comorbidity and body mass index (BMI) of 34.0 to 34.9 in adult 07/29/2019   Essential hypertension 07/25/2019   Mixed hyperlipidemia 07/25/2019   Acute right ankle pain 07/25/2019   Irritable bowel syndrome with diarrhea 07/25/2019   Cough 05/22/2019   Diarrhea of presumed infectious origin 05/22/2019   Other acute sinusitis 05/21/2019   Chronic bilateral low back pain without sciatica 11/06/2018     Discharge Diagnoses: Morbid obesity Patient Active Problem List   Diagnosis Date Noted   Morbid obesity with BMI of 40.0-44.9, adult (HCC) 12/09/2020   Generalized abdominal pain 10/29/2019   Class 1 obesity due to excess calories with serious comorbidity and body mass index (BMI) of 34.0 to 34.9 in adult 07/29/2019   Essential hypertension 07/25/2019   Mixed hyperlipidemia 07/25/2019   Acute right ankle pain 07/25/2019   Irritable bowel syndrome with diarrhea 07/25/2019   Cough 05/22/2019   Diarrhea of presumed infectious origin 05/22/2019   Other acute sinusitis 05/21/2019   Chronic bilateral low back pain without sciatica 11/06/2018    Operations: Procedure(s): ROBOTIC GASTRIC BYPASS RNY, UPPER GASTROINESTINAL ENDOSCOPY  Admission Condition: good  Discharged Condition: good  Indication for Admission: Morbid obesity  Hospital Course: Ms. Jobst underwent robotic gastric bypass on 12/09/20.  She recovered well per protocol and was discharged from the hospital.  Noted to have asymptomatic  sinus bradycardia during admission, similar to pre-op EKG.  Consults: None  Significant Diagnostic Studies: None  Treatments: surgery: as above  Disposition: Home  Patient Instructions:  Allergies as of 12/10/2020       Reactions   Sulfa Antibiotics Rash        Medication List     STOP taking these medications    meloxicam 15 MG tablet Commonly known as: MOBIC   meloxicam 7.5 MG tablet Commonly known as: MOBIC       TAKE these medications    acetaminophen 500 MG tablet Commonly known as: TYLENOL Take 2 tablets (1,000 mg total) by mouth every 8 (eight) hours for 5 days.   acidophilus Caps capsule Take 1 capsule by mouth daily.   atorvastatin 10 MG tablet Commonly known as: LIPITOR TAKE 1 TABLET(10 MG) BY MOUTH DAILY   cetirizine 10 MG tablet Commonly known as: ZYRTEC Take 10 mg by mouth daily as needed for allergies.   cyclobenzaprine 10 MG tablet Commonly known as: FLEXERIL Take 10 mg by mouth 3 (three) times daily as needed for muscle spasms.   famotidine 20 MG tablet Commonly known as: PEPCID TAKE 1 TABLET(20 MG) BY MOUTH TWICE DAILY What changed:  how much to take how to take this when to take this additional instructions   fluticasone 50 MCG/ACT nasal spray Commonly known as: FLONASE Place 2 sprays into both nostrils daily. What changed:  when to take this reasons to take this   gabapentin 100 MG capsule Commonly known as: NEURONTIN Take 2 capsules  by mouth every 12 hours.   omeprazole 40 MG capsule Commonly known as: PRILOSEC Take 1 capsule (40 mg total) by  mouth daily.   ondansetron 4 MG disintegrating tablet Commonly known as: ZOFRAN-ODT Allow 1 tablet to dissolve  by mouth every 6  hours as needed for nausea or vomiting.   oxyCODONE 5 MG immediate release tablet Commonly known as: Oxy IR/ROXICODONE Take 1 tablet by mouth every 6  hours as needed for severe pain.   pantoprazole 40 MG tablet Commonly known as: PROTONIX Take  1 tablet  by mouth daily.   promethazine 25 MG tablet Commonly known as: PHENERGAN Take 25 mg by mouth every 6 (six) hours as needed for vomiting.   rizatriptan 10 MG tablet Commonly known as: MAXALT Take one at onset of migraine/aura, repeat in 2 hours if not resolved. NO more than 2 in 24 hours.   verapamil 240 MG CR tablet Commonly known as: CALAN-SR TAKE 1 TABLET BY MOUTH DAILY Notes to patient: Monitor Blood Pressure Daily and keep a log for primary care physician.  You may need to make changes to your medications with rapid weight loss.          Activity: No heavy lifting for 4 weeks Diet: Bariatric protocol Wound Care: keep wound clean and dry  Follow-up:  With Dr. Dossie Der per bariatric protocol  Signed: Hyman Hopes [facility]   12/11/2020, 8:31 AM

## 2020-12-11 NOTE — Progress Notes (Signed)
24hr fluid recall prior to discharge: 630mL.  Per dehydration protocol, will call pt to f/u within one week post op. °

## 2020-12-12 ENCOUNTER — Telehealth (HOSPITAL_COMMUNITY): Payer: Self-pay | Admitting: *Deleted

## 2020-12-12 NOTE — Telephone Encounter (Signed)
1.  Tell me about your pain and pain management? Pt denies any current pain.  Pt states that she had only had to take one pain pill.  2.  Let's talk about fluid intake.  How much total fluid are you taking in? Pt states that she is getting in at least 50oz of fluid including protein shakes, bottled water, Gatorade Zero, jello, cream soups and broth. Pt states that she is working to meet goal of 64 oz of fluid today.  Pt encouraged to continue to work towards meeting goal.  Pt instructed to assess status and suggestions daily utilizing Hydration Action Plan on discharge folder and to call [facility] if in the "red zone".   3.  How much protein have you taken in the last 2 days? Pt states that she is working to meet goal of goal of 60g of protein today.  Pt has already consumed one protein shake.  Pt instructed to prioritize protein to ensure that she can meet daily goal.  4.  Have you had nausea?  Tell me about when have experienced nausea and what you did to help? Pt denies nausea.   5.  Has the frequency or color changed with your urine? Pt states that she is urinating "fine" with no changes in frequency or urgency.     6.  Tell me what your incisions look like? "Incisions look fine". Pt denies a fever, chills.  Pt states incisions are not swollen, open, or draining.  Pt encouraged to call [facility] if incisions change.   7.  Have you been passing gas? BM? Pt states that she has not had a BM.  Pt instructed to take either Miralax or MoM as instructed per "Gastric Bypass/Sleeve Discharge Home Care Instructions".  Pt to call surgeon's office if not able to have BM with medication.   8.  If a problem or question were to arise who would you call?  Do you know contact numbers for [facility], [facility], and [facility]? Pt denies dehydration symptoms.  Pt can describe s/sx of dehydration.  Pt knows to call [facility] for surgical, [facility] for nutrition, and [facility] for non-urgent questions or concerns.   9.  How has the walking going? Pt  states she is walking around and able to be active without difficulty.   10. Are you still using your incentive spirometer?  If so, how often? Pt encouraged to use incentive spirometer, at least 10x every hour while awake until she sees the surgeon.  11.  How are your vitamins and calcium going?  How are you taking them? Pt states that she is taking her supplements and vitamins without difficulty.  Reminded patient that the first 30 days post-operatively are important for successful recovery.  Practice good hand hygiene, wearing a mask when appropriate (since optional in most places), and minimizing exposure to people who live outside of the home, especially if they are exhibiting any respiratory, GI, or illness-like symptoms.

## 2020-12-23 ENCOUNTER — Other Ambulatory Visit: Payer: Self-pay

## 2020-12-23 ENCOUNTER — Encounter: Payer: BC Managed Care – PPO | Attending: Surgery | Admitting: Skilled Nursing Facility1

## 2020-12-23 DIAGNOSIS — Z6841 Body Mass Index (BMI) 40.0 and over, adult: Secondary | ICD-10-CM | POA: Diagnosis present

## 2020-12-24 NOTE — Progress Notes (Signed)
2 Week Post-Operative Nutrition Class   Patient was seen on 12/23/2020 for Post-Operative Nutrition education at the [facility].     Surgery date: 12/09/2020 Surgery type: RYGB Start weight at [facility]: 255.7 pounds Weight today: 230.5 pounds Bowel Habits: Every day to every other day no complaints   Body Composition Scale 12/23/2020  Current Body Weight 230.5  Total Body Fat % 43.9  Visceral Fat 15  Fat-Free Mass % 56   Total Body Water % 42.5  Muscle-Mass lbs 30.8  BMI 38.9  Body Fat Displacement          Torso  lbs 62.7         Left Leg  lbs 12.5         Right Leg  lbs 12.5         Left Arm  lbs 6.2         Right Arm   lbs 6.2      The following the learning objectives were met by the patient during this course: Identifies Phase 3 (Soft, High Proteins) Dietary Goals and will begin from 2 weeks post-operatively to 2 months post-operatively Identifies appropriate sources of fluids and proteins  Identifies appropriate fat sources and healthy verses unhealthy fat types   States protein recommendations and appropriate sources post-operatively Identifies the need for appropriate texture modifications, mastication, and bite sizes when consuming solids Identifies appropriate fat consumption and sources Identifies appropriate multivitamin and calcium sources post-operatively Describes the need for physical activity post-operatively and will follow MD recommendations States when to call healthcare provider regarding medication questions or post-operative complications   Handouts given during class include: Phase 3A: Soft, High Protein Diet Handout Phase 3 High Protein Meals Healthy Fats   Follow-Up Plan: Patient will follow-up at [facility] in 6 weeks for 2 month post-op nutrition visit for diet advancement per MD.

## 2020-12-25 ENCOUNTER — Ambulatory Visit: Payer: BC Managed Care – PPO | Admitting: Physician Assistant

## 2020-12-25 ENCOUNTER — Encounter: Payer: Self-pay | Admitting: Physician Assistant

## 2020-12-25 ENCOUNTER — Other Ambulatory Visit: Payer: Self-pay

## 2020-12-25 VITALS — BP 120/70 | HR 70 | Temp 96.1°F | Ht 65.0 in | Wt 229.0 lb

## 2020-12-25 DIAGNOSIS — I10 Essential (primary) hypertension: Secondary | ICD-10-CM

## 2020-12-25 NOTE — Progress Notes (Signed)
Subjective:  Patient ID: Debbie Stark, female    DOB: 04/01/1966  Age: 55 y.o. MRN: [id number]  Chief Complaint  Patient presents with   Hypertension    HPI  Pt with history of hypertension but had gastric bypass surgery on 8/30 and all of her medications were stopped at that time - was told she would not need to be taking the bp medication that she had been on (she was on lisinopril at one time but it was discontinued due to abnormal kidney functions and cardizem 240mg ) States she has been feeling ok and denies chest pain or shortness of breath but yesterday took bp at home and it was 140s/80s-95  She did mention that her kidney functions were elevated at hospital discharge as well - recommend to repeat today Current Outpatient Medications on File Prior to Visit  Medication Sig Dispense Refill   omeprazole (PRILOSEC) 40 MG capsule Take 1 capsule (40 mg total) by mouth daily. 90 capsule 3   ondansetron (ZOFRAN-ODT) 4 MG disintegrating tablet Allow 1 tablet to dissolve  by mouth every 6  hours as needed for nausea or vomiting. 20 tablet 0   oxyCODONE (OXY IR/ROXICODONE) 5 MG immediate release tablet Take 1 tablet by mouth every 6  hours as needed for severe pain. 10 tablet 0   pantoprazole (PROTONIX) 40 MG tablet Take 1 tablet  by mouth daily. 90 tablet 0   No current facility-administered medications on file prior to visit.   Past Medical History:  Diagnosis Date   Arthritis    lower back pain   GERD (gastroesophageal reflux disease)    Hypertension    IBS (irritable bowel syndrome)    with constipation   Migraine with aura, intractable, without status migrainosus    Sleep apnea 07/2020   C-Pap   Past Surgical History:  Procedure Laterality Date   tummy tuck      Family History  Problem Relation Age of Onset   Cancer Mother        endometrial   Diabetes Father    Hyperlipidemia Father    Hypertension Father    Heart disease Father    Stroke Father    Social History    Socioeconomic History   Marital status: Married    Spouse name: Not on file   Number of children: Not on file   Years of education: Not on file   Highest education level: Not on file  Occupational History   Not on file  Tobacco Use   Smoking status: Never   Smokeless tobacco: Never  Vaping Use   Vaping Use: Never used  Substance and Sexual Activity   Alcohol use: Not Currently   Drug use: Never   Sexual activity: Not Currently  Other Topics Concern   Not on file  Social History Narrative   Not on file   Social Determinants of Health   Financial Resource Strain: Not on file  Food Insecurity: Not on file  Transportation Needs: Not on file  Physical Activity: Not on file  Stress: Not on file  Social Connections: Not on file    Review of Systems CONSTITUTIONAL: Negative for chills, fatigue, fever, unintentional weight gain and unintentional weight loss.  CARDIOVASCULAR: Negative for chest pain, dizziness, palpitations and pedal edema.  RESPIRATORY: Negative for recent cough and dyspnea.  INTEGUMENTARY: Negative for rash.  NEUROLOGICAL: Negative for dizziness and headaches.        Objective:  BP 120/70 (BP Location: Left Arm, Patient  Position: Sitting, Cuff Size: Large)   Pulse 70   Temp (!) 96.1 F (35.6 C) (Temporal)   Ht 5\' 5"  (1.651 m)   Wt 229 lb (103.9 kg)   SpO2 97%   BMI 38.11 kg/m  Bp recheck 124/84 BP/Weight 12/25/2020 12/23/2020 12/10/2020  Systolic BP 120 - 156  Diastolic BP 70 - 84  Wt. (Lbs) 229 230.5 -  BMI 38.11 38.95 -    Physical Exam PHYSICAL EXAM:   VS: BP 120/70 (BP Location: Left Arm, Patient Position: Sitting, Cuff Size: Large)   Pulse 70   Temp (!) 96.1 F (35.6 C) (Temporal)   Ht 5\' 5"  (1.651 m)   Wt 229 lb (103.9 kg)   SpO2 97%   BMI 38.11 kg/m   GEN: Well nourished, well developed, in no acute distress  Cardiac: RRR; no murmurs, rubs, or gallops,no edema - Respiratory:  normal respiratory rate and pattern with no  distress - normal breath sounds with no rales, rhonchi, wheezes or rubs Psych: euthymic mood, appropriate affect and demeanor  Diabetic Foot Exam - Simple   No data filed      Lab Results  Component Value Date   WBC 8.6 12/10/2020   HGB 11.9 (L) 12/10/2020   HCT 35.4 (L) 12/10/2020   PLT 133 (L) 12/10/2020   GLUCOSE 114 (H) 11/28/2020   CHOL 165 05/01/2020   TRIG 96 05/01/2020   HDL 52 05/01/2020   LDLCALC 95 05/01/2020   ALT 19 11/28/2020   AST 21 11/28/2020   NA 139 11/28/2020   K 3.8 11/28/2020   CL 105 11/28/2020   CREATININE 1.26 (H) 12/09/2020   BUN 23 (H) 11/28/2020   CO2 26 11/28/2020   TSH 2.190 07/31/2020   INR 1.0 07/31/2020   HGBA1C 5.6 10/09/2020      Assessment & Plan:   Problem List Items Addressed This Visit   None Visit Diagnoses     Benign hypertension    -  Primary At this juncture recommend to continue to monitor bp and come in next week for bp check next week and bring her monitor as well Recommend to watch sodium intake as she reintroduces food into her diet (pt has appt with nutritionist)     .  No orders of the defined types were placed in this encounter.   Orders Placed This Encounter  Procedures   CBC with Differential/Platelet   Comprehensive metabolic panel      Follow-up: Return for nurse visit next week for bp check.  An After Visit Summary was printed and given to the patient.  08/02/2020 [facility] [phone]

## 2020-12-26 LAB — COMPREHENSIVE METABOLIC PANEL
ALT: 23 IU/L (ref 0–32)
AST: 25 IU/L (ref 0–40)
Albumin/Globulin Ratio: 1.6 (ref 1.2–2.2)
Albumin: 4.4 g/dL (ref 3.8–4.9)
Alkaline Phosphatase: 77 IU/L (ref 44–121)
BUN/Creatinine Ratio: 18 (ref 9–23)
BUN: 16 mg/dL (ref 6–24)
Bilirubin Total: 0.6 mg/dL (ref 0.0–1.2)
CO2: 23 mmol/L (ref 20–29)
Calcium: 9.2 mg/dL (ref 8.7–10.2)
Chloride: 102 mmol/L (ref 96–106)
Creatinine, Ser: 0.89 mg/dL (ref 0.57–1.00)
Globulin, Total: 2.8 g/dL (ref 1.5–4.5)
Glucose: 85 mg/dL (ref 65–99)
Potassium: 4.2 mmol/L (ref 3.5–5.2)
Sodium: 139 mmol/L (ref 134–144)
Total Protein: 7.2 g/dL (ref 6.0–8.5)
eGFR: 77 mL/min/{1.73_m2} (ref >59)

## 2020-12-26 LAB — CBC WITH DIFFERENTIAL/PLATELET
Basophils Absolute: 0 10*3/uL (ref 0.0–0.2)
Basos: 1 %
EOS (ABSOLUTE): 0.3 10*3/uL (ref 0.0–0.4)
Eos: 4 %
Hematocrit: 42.8 % (ref 34.0–46.6)
Hemoglobin: 14.2 g/dL (ref 11.1–15.9)
Immature Grans (Abs): 0 10*3/uL (ref 0.0–0.1)
Immature Granulocytes: 0 %
Lymphocytes Absolute: 2 10*3/uL (ref 0.7–3.1)
Lymphs: 31 %
MCH: 30.1 pg (ref 26.6–33.0)
MCHC: 33.2 g/dL (ref 31.5–35.7)
MCV: 91 fL (ref 79–97)
Monocytes Absolute: 0.5 10*3/uL (ref 0.1–0.9)
Monocytes: 8 %
Neutrophils Absolute: 3.8 10*3/uL (ref 1.4–7.0)
Neutrophils: 56 %
Platelets: 236 10*3/uL (ref 150–450)
RBC: 4.71 x10E6/uL (ref 3.77–5.28)
RDW: 12.2 % (ref 11.7–15.4)
WBC: 6.6 10*3/uL (ref 3.4–10.8)

## 2020-12-29 ENCOUNTER — Telehealth: Payer: Self-pay | Admitting: Skilled Nursing Facility1

## 2020-12-29 NOTE — Telephone Encounter (Signed)
RD called pt to verify fluid intake once starting soft, solid proteins 2 week post-bariatric surgery.   Daily Fluid intake: 64 oz Daily Protein intake: 60 g Bowel Habits: every day to every other day  Concerns/issues:   None stated 

## 2021-01-01 ENCOUNTER — Telehealth: Payer: Self-pay

## 2021-01-01 NOTE — Telephone Encounter (Signed)
Pt calling as she received a text from [employer] requesting she see provider to read results of sleep apnea machine. She is approaching her 30 day mark of using machine and insurance is requiring provider read results every so often for certain period. Pt believes this will be every 30 days for 90 day period then once a year. She does not know if PCP could do this or if specialist would need to read. Will send note to provider for information on this matter.  For any appointment pt will need late afternoon due to work schedule.  Callback #: [phone]  Lorita Officer, CCMA 01/01/21 11:13 AM

## 2021-01-01 NOTE — Telephone Encounter (Signed)
Left message for patient to call with the phone number on her machine for [employer].

## 2021-01-01 NOTE — Telephone Encounter (Signed)
Pt returned call. [employer] number on machine is [phone].   Attempted to call [employer], call was ended after holding on call for 11 minutes. Will attempt again later.   Terrill Mohr 01/01/21 4:41 PM

## 2021-01-01 NOTE — Telephone Encounter (Signed)
Called pt. Her machine is from [employer]. Pt is scheduled 9/27.   Lorita Officer, CCMA 01/01/21 12:12 PM

## 2021-01-05 NOTE — Telephone Encounter (Signed)
[employer] notified.  Results to be faxed to the office.

## 2021-01-06 ENCOUNTER — Encounter: Payer: Self-pay | Admitting: Family Medicine

## 2021-01-06 ENCOUNTER — Ambulatory Visit: Payer: BC Managed Care – PPO | Admitting: Family Medicine

## 2021-01-06 ENCOUNTER — Other Ambulatory Visit: Payer: Self-pay

## 2021-01-06 VITALS — BP 120/70 | HR 80 | Temp 97.7°F | Resp 16 | Ht 65.0 in | Wt 231.0 lb

## 2021-01-06 DIAGNOSIS — E66812 Obesity, class 2: Secondary | ICD-10-CM

## 2021-01-06 DIAGNOSIS — Z9884 Bariatric surgery status: Secondary | ICD-10-CM

## 2021-01-06 DIAGNOSIS — R001 Bradycardia, unspecified: Secondary | ICD-10-CM

## 2021-01-06 DIAGNOSIS — Z6838 Body mass index (BMI) 38.0-38.9, adult: Secondary | ICD-10-CM | POA: Diagnosis not present

## 2021-01-06 DIAGNOSIS — M79602 Pain in left arm: Secondary | ICD-10-CM

## 2021-01-06 DIAGNOSIS — G4733 Obstructive sleep apnea (adult) (pediatric): Secondary | ICD-10-CM

## 2021-01-06 DIAGNOSIS — R5383 Other fatigue: Secondary | ICD-10-CM

## 2021-01-06 HISTORY — DX: Other fatigue: R53.83

## 2021-01-06 HISTORY — DX: Obstructive sleep apnea (adult) (pediatric): G47.33

## 2021-01-06 HISTORY — DX: Pain in left arm: M79.602

## 2021-01-06 HISTORY — DX: Bradycardia, unspecified: R00.1

## 2021-01-06 HISTORY — DX: Bariatric surgery status: Z98.84

## 2021-01-06 LAB — CBC WITH DIFFERENTIAL/PLATELET
Basophils Absolute: 0 10*3/uL (ref 0.0–0.2)
Basos: 1 %
EOS (ABSOLUTE): 0.3 10*3/uL (ref 0.0–0.4)
Eos: 5 %
Hematocrit: 39.3 % (ref 34.0–46.6)
Hemoglobin: 13.1 g/dL (ref 11.1–15.9)
Immature Grans (Abs): 0 10*3/uL (ref 0.0–0.1)
Immature Granulocytes: 0 %
Lymphocytes Absolute: 2.6 10*3/uL (ref 0.7–3.1)
Lymphs: 44 %
MCH: 30.1 pg (ref 26.6–33.0)
MCHC: 33.3 g/dL (ref 31.5–35.7)
MCV: 90 fL (ref 79–97)
Monocytes Absolute: 0.4 10*3/uL (ref 0.1–0.9)
Monocytes: 7 %
Neutrophils Absolute: 2.6 10*3/uL (ref 1.4–7.0)
Neutrophils: 43 %
Platelets: 195 10*3/uL (ref 150–450)
RBC: 4.35 x10E6/uL (ref 3.77–5.28)
RDW: 12.6 % (ref 11.7–15.4)
WBC: 6 10*3/uL (ref 3.4–10.8)

## 2021-01-06 LAB — COMPREHENSIVE METABOLIC PANEL
ALT: 25 IU/L (ref 0–32)
AST: 27 IU/L (ref 0–40)
Albumin/Globulin Ratio: 1.7 (ref 1.2–2.2)
Albumin: 4.4 g/dL (ref 3.8–4.9)
Alkaline Phosphatase: 73 IU/L (ref 44–121)
BUN/Creatinine Ratio: 23 (ref 9–23)
BUN: 21 mg/dL (ref 6–24)
Bilirubin Total: 0.5 mg/dL (ref 0.0–1.2)
CO2: 22 mmol/L (ref 20–29)
Calcium: 9.4 mg/dL (ref 8.7–10.2)
Chloride: 102 mmol/L (ref 96–106)
Creatinine, Ser: 0.93 mg/dL (ref 0.57–1.00)
Globulin, Total: 2.6 g/dL (ref 1.5–4.5)
Glucose: 84 mg/dL (ref 70–99)
Potassium: 4.1 mmol/L (ref 3.5–5.2)
Sodium: 140 mmol/L (ref 134–144)
Total Protein: 7 g/dL (ref 6.0–8.5)
eGFR: 73 mL/min/{1.73_m2} (ref >59)

## 2021-01-06 LAB — TROPONIN T: Troponin T (Highly Sensitive): <6 ng/L (ref 0–14)

## 2021-01-06 LAB — PHOSPHORUS: Phosphorus: 3.2 mg/dL (ref 3.0–4.3)

## 2021-01-06 LAB — MAGNESIUM: Magnesium: 1.9 mg/dL (ref 1.6–2.3)

## 2021-01-06 NOTE — Assessment & Plan Note (Signed)
Check labs  Refer to cardiology  

## 2021-01-06 NOTE — Progress Notes (Addendum)
Duplicate

## 2021-01-06 NOTE — Assessment & Plan Note (Signed)
Check stat troponin.  Concerned may be an anginal variant.

## 2021-01-06 NOTE — Assessment & Plan Note (Signed)
Recent surgery 12/09/2020. Minimal calories still 600 calorie.

## 2021-01-06 NOTE — Progress Notes (Signed)
Subjective:  Patient ID: Debbie Stark, female    DOB: 04/27/1965  Age: 55 y.o. MRN: [id number]  Chief Complaint  Patient presents with   Sleep Apnea    HPI Patient is a 55 year old white female who presents for follow-up following bariatric surgery.  Patient has obstructive sleep apnea that was diagnosed approximately 4 months ago.  She is using her CPAP compliantly and has significantly benefited.  This was better seen prior to the surgery.  She has been tired since her surgery.  She is currently on 600-calorie diet and feels she is tired due to this.  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  Denies shortness of breath or chest pain with this.  She said it lasted for well over 2 hours.  She has not had it prior to this or since.  Current Outpatient Medications on File Prior to Visit  Medication Sig Dispense Refill   ondansetron (ZOFRAN-ODT) 4 MG disintegrating tablet Allow 1 tablet to dissolve  by mouth every 6  hours as needed for nausea or vomiting. 20 tablet 0   pantoprazole (PROTONIX) 40 MG tablet Take 1 tablet  by mouth daily. 90 tablet 0   No current facility-administered medications on file prior to visit.   Past Medical History:  Diagnosis Date   Arthritis    lower back pain   Essential hypertension 07/25/2019   GERD (gastroesophageal reflux disease)    Hypertension    IBS (irritable bowel syndrome)    with constipation   Migraine with aura, intractable, without status migrainosus    Sleep apnea 07/2020   C-Pap   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    Family History  Problem Relation Age of Onset   Cancer Mother        endometrial   Diabetes Father    Hyperlipidemia Father    Hypertension Father    Heart disease Father    Stroke Father    Social History   Socioeconomic History   Marital  status: Married    Spouse name: Not on file   Number of children: Not on file   Years of education: Not on file   Highest education level: Not on file  Occupational History   Not on file  Tobacco Use   Smoking status: Never   Smokeless tobacco: Never  Vaping Use   Vaping Use: Never used  Substance and Sexual Activity   Alcohol use: Not Currently   Drug use: Never   Sexual activity: Not Currently  Other Topics Concern   Not on file  Social History Narrative   Not on file   Social Determinants of Health   Financial Resource Strain: Not on file  Food Insecurity: Not on file  Transportation Needs: Not on file  Physical Activity: Not on file  Stress: Not on file  Social Connections: Not on file    Review of Systems  Constitutional:  Positive for fatigue. Negative for chills and fever.  HENT:  Negative for congestion, ear pain and sore throat.   Respiratory:  Negative for cough and shortness of breath.   Gastrointestinal:  Negative for abdominal pain, constipation, diarrhea, nausea and vomiting.  Genitourinary:  Negative for dysuria and urgency.  Musculoskeletal:  Negative for arthralgias and myalgias.  Skin:  Negative for rash.  Neurological:  Negative for  dizziness and headaches.  Psychiatric/Behavioral:  Negative for dysphoric mood. The patient is not nervous/anxious.     Objective:  BP 120/70   Pulse 80   Temp 97.7 F (36.5 C)   Resp 16   Ht 5\' 5"  (1.651 m)   Wt 231 lb (104.8 kg)   BMI 38.44 kg/m   BP/Weight 01/06/2021 12/25/2020 12/23/2020  Systolic BP 120 120 -  Diastolic BP 70 70 -  Wt. (Lbs) 231 229 230.5  BMI 38.44 38.11 38.95    Physical Exam Vitals reviewed.  Constitutional:      Appearance: Normal appearance. She is obese.  Cardiovascular:     Rate and Rhythm: Bradycardia present.     Heart sounds: Normal heart sounds.  Pulmonary:     Effort: Pulmonary effort is normal. No respiratory distress.     Breath sounds: Normal breath sounds.   Abdominal:     General: Abdomen is flat. Bowel sounds are normal.     Palpations: Abdomen is soft.     Tenderness: There is no abdominal tenderness.  Neurological:     Mental Status: She is alert and oriented to person, place, and time.  Psychiatric:        Mood and Affect: Mood normal.        Behavior: Behavior normal.    Diabetic Foot Exam - Simple   No data filed      Lab Results  Component Value Date   WBC 6.6 12/25/2020   HGB 14.2 12/25/2020   HCT 42.8 12/25/2020   PLT 236 12/25/2020   GLUCOSE 85 12/25/2020   CHOL 165 05/01/2020   TRIG 96 05/01/2020   HDL 52 05/01/2020   LDLCALC 95 05/01/2020   ALT 23 12/25/2020   AST 25 12/25/2020   NA 139 12/25/2020   K 4.2 12/25/2020   CL 102 12/25/2020   CREATININE 0.89 12/25/2020   BUN 16 12/25/2020   CO2 23 12/25/2020   TSH 2.190 07/31/2020   INR 1.0 07/31/2020   HGBA1C 5.6 10/09/2020      Assessment & Plan:   Problem List Items Addressed This Visit       Respiratory   OSA (obstructive sleep apnea)    Pt is compliant and benefits from use of cpap.        Other   RESOLVED: Morbid obesity with BMI of 40.0-44.9, adult (HCC)   Left arm pain    Check stat troponin.  Concerned may be an anginal variant.       Relevant Orders   EKG 12-Lead   Troponin T   Bradycardia - Primary    Check labs.  Refer to cardiology.       Relevant Orders   CBC with Differential/Platelet   Comprehensive metabolic panel   Phosphorus   Magnesium   TSH   History of bariatric surgery    Recent surgery 12/09/2020. Minimal calories still 600 calorie.        Fatigue    Likely secondary to bradycardia.  Check labs.       .  No orders of the defined types were placed in this encounter.   Orders Placed This Encounter  Procedures   CBC with Differential/Platelet   Comprehensive metabolic panel   Phosphorus   Magnesium   Troponin T   TSH   EKG 12-Lead     Follow-up: No follow-ups on file.  An After Visit  Summary was printed and given to the patient.  12/11/2020, MD [facility] [phone]

## 2021-01-06 NOTE — Assessment & Plan Note (Signed)
Likely secondary to bradycardia.  Check labs.

## 2021-01-06 NOTE — Assessment & Plan Note (Signed)
Pt is compliant and benefits from use of cpap.

## 2021-01-06 NOTE — Assessment & Plan Note (Signed)
Improving.

## 2021-01-07 LAB — TSH: TSH: 2.76 u[IU]/mL (ref 0.450–4.500)

## 2021-01-08 DIAGNOSIS — G43119 Migraine with aura, intractable, without status migrainosus: Secondary | ICD-10-CM | POA: Insufficient documentation

## 2021-01-08 DIAGNOSIS — I10 Essential (primary) hypertension: Secondary | ICD-10-CM | POA: Insufficient documentation

## 2021-01-08 DIAGNOSIS — M1990 Unspecified osteoarthritis, unspecified site: Secondary | ICD-10-CM | POA: Insufficient documentation

## 2021-01-12 ENCOUNTER — Ambulatory Visit: Payer: BC Managed Care – PPO | Admitting: Cardiology

## 2021-01-15 ENCOUNTER — Encounter: Payer: Self-pay | Admitting: Physician Assistant

## 2021-01-15 ENCOUNTER — Ambulatory Visit: Payer: BC Managed Care – PPO | Admitting: Physician Assistant

## 2021-01-15 VITALS — BP 120/78 | HR 101 | Temp 97.2°F | Ht 65.0 in | Wt 223.4 lb

## 2021-01-15 DIAGNOSIS — J039 Acute tonsillitis, unspecified: Secondary | ICD-10-CM | POA: Diagnosis not present

## 2021-01-15 LAB — POCT RAPID STREP A: Strep A Ag: NOT DETECTED

## 2021-01-15 LAB — POC COVID19 BINAXNOW: SARS Coronavirus 2 Ag: NEGATIVE

## 2021-01-15 MED ORDER — AZITHROMYCIN 250 MG PO TABS: ORAL_TABLET | ORAL | 0 refills | Status: AC

## 2021-01-15 NOTE — Progress Notes (Signed)
Acute Office Visit  Subjective:    Patient ID: Debbie Stark, female    DOB: Nov 07, 1965, 55 y.o.   MRN: [id number]  Chief Complaint  Patient presents with   Sore Throat    HPI Patient is in today for complaints of sore throat and fever that started yesterday - she has had mild congestion as well - denies cough Has had mild headache - has taken nyquil for her symptoms  Past Medical History:  Diagnosis Date   Arthritis    lower back pain   Bradycardia 01/06/2021   Chronic bilateral low back pain without sciatica 11/06/2018   Last Assessment & Plan:  Formatting of this note might be different from the original. Will order plain films of the lumbar spine to further evaluate lumbar pathology. Pattern of pain does appear to be facet mediated, and discussed with patient that she would benefit from lumbar epidural steroid injections as well as medial branch blocks.  However, due to significant improvement in pain following    Class 2 severe obesity with serious comorbidity and body mass index (BMI) of 38.0 to 38.9 in adult [facility]) 07/29/2019   Fatigue 01/06/2021   GERD (gastroesophageal reflux disease)    History of bariatric surgery 01/06/2021   Hypertension    Left arm pain 01/06/2021   Migraine with aura, intractable, without status migrainosus    Mixed hyperlipidemia 07/25/2019   OSA (obstructive sleep apnea) 01/06/2021   Dxd 08/2020. Report reviewed today.  Pt is compliant 29/30 days in the last month. The day she missed was when she was in the hospital. Benefits.    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    Family History  Problem Relation Age of Onset   Cancer Mother        endometrial   Diabetes Father    Hyperlipidemia Father    Hypertension Father    Heart disease Father    Stroke Father     Social History   Socioeconomic History   Marital status: Married    Spouse name: Not on file   Number of children: Not on file    Years of education: Not on file   Highest education level: Not on file  Occupational History   Not on file  Tobacco Use   Smoking status: Never   Smokeless tobacco: Never  Vaping Use   Vaping Use: Never used  Substance and Sexual Activity   Alcohol use: Not Currently   Drug use: Never   Sexual activity: Not Currently  Other Topics Concern   Not on file  Social History Narrative   Not on file   Social Determinants of Health   Financial Resource Strain: Not on file  Food Insecurity: Not on file  Transportation Needs: Not on file  Physical Activity: Not on file  Stress: Not on file  Social Connections: Not on file  Intimate Partner Violence: Not on file    Outpatient Medications Prior to Visit  Medication Sig Dispense Refill   ondansetron (ZOFRAN-ODT) 4 MG disintegrating tablet Allow 1 tablet to dissolve  by mouth every 6  hours as needed for nausea or vomiting. 20 tablet 0   pantoprazole (PROTONIX) 40 MG tablet Take 1 tablet  by mouth daily. 90 tablet 0   No facility-administered medications prior to visit.    Allergies  Allergen Reactions   Sulfa Antibiotics Rash    Review of Systems CONSTITUTIONAL: see HPI E/N/T: see  HPI CARDIOVASCULAR: Negative for chest pain, dizziness, palpitations and pedal edema.  RESPIRATORY: Negative for recent cough and dyspnea.  GASTROINTESTINAL: Negative for abdominal pain, acid reflux symptoms, constipation, diarrhea, nausea and vomiting.       Objective:    Physical Exam PHYSICAL EXAM:   VS: BP 120/78 (BP Location: Left Arm, Patient Position: Sitting, Cuff Size: Large)   Pulse (!) 101   Temp (!) 97.2 F (36.2 C) (Temporal)   Ht _0  (1.651 m)   Wt 223 lb 6.4 oz (101.3 kg)   SpO2 95%   BMI 37.18 kg/m   GEN: Well nourished, well developed, in no acute distress  HEENT: normal external ears and nose - normal external auditory canals and TMS -  - Lips, Teeth and Gums - normal  Oropharynx - erythema and exudate  noted Cardiac: RRR; no murmurs, rubs, or gallops,no edema -  Respiratory:  normal respiratory rate and pattern with no distress - normal breath sounds with no rales, rhonchi, wheezes or rubs  Office Visit on 01/15/2021  Component Date Value Ref Range Status   Strep A Ag 01/15/2021 None Detected  None Detected Final   SARS Coronavirus 2 Ag 01/15/2021 Negative  Negative Final    BP 120/78 (BP Location: Left Arm, Patient Position: Sitting, Cuff Size: Large)   Pulse (!) 101   Temp (!) 97.2 F (36.2 C) (Temporal)   Ht _1  (1.651 m)   Wt 223 lb 6.4 oz (101.3 kg)   SpO2 95%   BMI 37.18 kg/m  Wt Readings from Last 3 Encounters:  01/15/21 223 lb 6.4 oz (101.3 kg)  01/06/21 231 lb (104.8 kg)  12/25/20 229 lb (103.9 kg)    Health Maintenance Due  Topic Date Due   HIV Screening  Never done   Hepatitis C Screening  Never done   Zoster Vaccines- Shingrix (1 of 2) Never done   MAMMOGRAM  01/03/2020   COVID-19 Vaccine (3 - Booster for Moderna series) 02/15/2020   INFLUENZA VACCINE  11/10/2020    There are no preventive care reminders to display for this patient.   Lab Results  Component Value Date   TSH 2.760 01/06/2021   Lab Results  Component Value Date   WBC 6.0 01/06/2021   HGB 13.1 01/06/2021   HCT 39.3 01/06/2021   MCV 90 01/06/2021   PLT 195 01/06/2021   Lab Results  Component Value Date   NA 140 01/06/2021   K 4.1 01/06/2021   CO2 22 01/06/2021   GLUCOSE 84 01/06/2021   BUN 21 01/06/2021   CREATININE 0.93 01/06/2021   BILITOT 0.5 01/06/2021   ALKPHOS 73 01/06/2021   AST 27 01/06/2021   ALT 25 01/06/2021   PROT 7.0 01/06/2021   ALBUMIN 4.4 01/06/2021   CALCIUM 9.4 01/06/2021   ANIONGAP 8 11/28/2020   EGFR 73 01/06/2021   Lab Results  Component Value Date   CHOL 165 05/01/2020   Lab Results  Component Value Date   HDL 52 05/01/2020   Lab Results  Component Value Date   LDLCALC 95 05/01/2020   Lab Results  Component Value Date   TRIG 96  05/01/2020   Lab Results  Component Value Date   CHOLHDL 3.2 05/01/2020   Lab Results  Component Value Date   HGBA1C 5.6 10/09/2020       Assessment & Plan:   Problem List Items Addressed This Visit       Respiratory   Tonsillitis - Primary  Relevant Medications   azithromycin (ZITHROMAX) 250 MG tablet Recommend warm salt water gargles Tylenol as needed   Other Relevant Orders   POCT Rapid Strep A (Completed)   POC COVID-19 BinaxNow (Completed)   Meds ordered this encounter  Medications   azithromycin (ZITHROMAX) 250 MG tablet    Sig: Take 2 tablets on day 1, then 1 tablet daily on days 2 through 5    Dispense:  6 tablet    Refill:  0    Order Specific Question:   Supervising Provider    AnswerShelton Silvas    Orders Placed This Encounter  Procedures   POC COVID-19 BinaxNow     Follow-up: Return if symptoms worsen or fail to improve.  An After Visit Summary was printed and given to the patient.  Yetta Flock [facility] [phone]

## 2021-02-05 ENCOUNTER — Encounter: Payer: BC Managed Care – PPO | Attending: Surgery | Admitting: Skilled Nursing Facility1

## 2021-02-05 ENCOUNTER — Other Ambulatory Visit: Payer: Self-pay

## 2021-02-05 DIAGNOSIS — Z6838 Body mass index (BMI) 38.0-38.9, adult: Secondary | ICD-10-CM | POA: Diagnosis present

## 2021-02-05 NOTE — Progress Notes (Signed)
Bariatric Nutrition Follow-Up Visit Medical Nutrition Therapy    NUTRITION ASSESSMENT    Surgery date: 12/09/2020 Surgery type: RYGB Start weight at [facility]: 255.7 pounds Weight today: 217.3 pounds   Body Composition Scale 12/23/2020 02/05/2021  Current Body Weight 230.5 217.3  Total Body Fat % 43.9 43.8  Visceral Fat 15 15  Fat-Free Mass % 56 56.1   Total Body Water % 42.5 42.5  Muscle-Mass lbs 30.8 29.2  BMI 38.9 39.2  Body Fat Displacement           Torso  lbs 62.7 59         Left Leg  lbs 12.5 11.8         Right Leg  lbs 12.5 11.8         Left Arm  lbs 6.2 5.9         Right Arm   lbs 6.2 5.9   Clinical  Medical hx: migraine, GERD, sleep apnea, HTN, IBS Medications: see list Labs:  Notable signs/symptoms: headaches Any previous deficiencies? No   Lifestyle & Dietary Hx   Pt states everything is going well and has a very supportive husband.   Pt states she wants to come back in a combined appt with her husband once he gets his referral.    Estimated daily fluid intake:  oz Estimated daily protein intake: 60+ g Supplements: multi and calcium Current average weekly physical activity: ADL's   24-Hr Dietary Recall First Meal: protein shake or yogurt Snack:   Second Meal: yogurt and meat Snack:   Third Meal: chicken and cheese Snack: sugar free jello Beverages: water, Hint water, coffee + protein shake or powder creamer  Post-Op Goals/ Signs/ Symptoms Using straws: no Drinking while eating: no Chewing/swallowing difficulties: no Changes in vision: no Changes to mood/headaches: no Hair loss/changes to skin/nails: no Difficulty focusing/concentrating: no Sweating: no Limb weakness: no Dizziness/lightheadedness: no Palpitations: no  Carbonated/caffeinated beverages: no N/V/D/C/Gas: no Abdominal pain: no Dumping syndrome: no    NUTRITION DIAGNOSIS  Overweight/obesity (Ethel-3.3) related to past poor dietary habits and physical inactivity as evidenced by  completed bariatric surgery and following dietary guidelines for continued weight loss and healthy nutrition status.     NUTRITION INTERVENTION Nutrition counseling (C-1) and education (E-2) to facilitate bariatric surgery goals, including: Diet advancement to the next phase (phase 4) now including non starchy  The importance of consuming adequate calories as well as certain nutrients daily due to the body's need for essential vitamins, minerals, and fats The importance of daily physical activity and to reach a goal of at least 150 minutes of moderate to vigorous physical activity weekly (or as directed by their physician) due to benefits such as increased musculature and improved lab values The importance of intuitive eating specifically learning hunger-satiety cues and understanding the importance of learning a new body: The importance of mindful eating to avoid grazing behaviors  Goals: -Continue to aim for a minimum of 64 fluid ounces 7 days a week with at least 30 ounces being plain water  -Eat non-starchy vegetables 2 times a day 7 days a week  -Start out with soft cooked vegetables today and tomorrow; if tolerated begin to eat raw vegetables or cooked including salads  -Eat your 3 ounces of protein first then start in on your non-starchy vegetables; once you understand how much of your meal leads to satisfaction and not full while still eating 3 ounces of protein and non-starchy vegetables you can eat them in any order   -  Continue to aim for 30 minutes of activity at least 5 times a week  -Do NOT cook with/add to your food: alfredo sauce, cheese sauce, barbeque sauce, ketchup, fat back, butter, bacon grease, grease, Crisco, OR SUGAR   Handouts Provided Include  Phase 4  Learning Style & Readiness for Change Teaching method utilized: Visual & Auditory  Demonstrated degree of understanding via: Teach Back  Readiness Level: Action Barriers to learning/adherence to lifestyle change:  none identified   RD's Notes for Next Visit Assess adherence to pt chosen goals    MONITORING & EVALUATION Dietary intake, weekly physical activity, body weight  Next Steps Patient is to follow-up in 3-4 months

## 2021-04-27 LAB — RESULTS CONSOLE HPV: CHL HPV: NEGATIVE

## 2021-04-27 LAB — HM PAP SMEAR: HM Pap smear: NEGATIVE

## 2021-04-30 ENCOUNTER — Telehealth: Payer: Self-pay

## 2021-04-30 NOTE — Telephone Encounter (Signed)
Patient left VM stating she has been having troubles with allergies.   Left detailed message for pt to make an appointment as she has not been seen since October. She is to return call.   Lorita Officer, [place] 04/30/21 2:56 PM

## 2021-05-04 NOTE — Progress Notes (Signed)
Acute Office Visit  Subjective:    Patient ID: Debbie Stark, female    DOB: Sep 10, 1965, 56 y.o.   MRN: [id number]  Chief Complaint  Patient presents with   Sinusitis    HPI: Patient is in today for sinus pressure/pain, headache, rhinorrhea, post-nasal-drip, and coughing. Onset of symptoms was four weeks ago. Treatment has included otc Xyzal, Claritin, Zyrtec, Benadryl, and Coricidin.She has a past history of chronic allergic rhinitis.    Past Medical History:  Diagnosis Date   Arthritis    lower back pain   Bradycardia 01/06/2021   Chronic bilateral low back pain without sciatica 11/06/2018   Last Assessment & Plan:  Formatting of this note might be different from the original. Will order plain films of the lumbar spine to further evaluate lumbar pathology. Pattern of pain does appear to be facet mediated, and discussed with patient that she would benefit from lumbar epidural steroid injections as well as medial branch blocks.  However, due to significant improvement in pain following    Class 2 severe obesity with serious comorbidity and body mass index (BMI) of 38.0 to 38.9 in adult [facility]) 07/29/2019   Fatigue 01/06/2021   GERD (gastroesophageal reflux disease)    History of bariatric surgery 01/06/2021   Hypertension    Left arm pain 01/06/2021   Migraine with aura, intractable, without status migrainosus    Mixed hyperlipidemia 07/25/2019   OSA (obstructive sleep apnea) 01/06/2021   Dxd 08/2020. Report reviewed today.  Pt is compliant 29/30 days in the last month. The day she missed was when she was in the hospital. Benefits.    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    Family History  Problem Relation Age of Onset   Cancer Mother        endometrial   Diabetes Father    Hyperlipidemia Father    Hypertension Father    Heart disease Father    Stroke Father     Social History   Socioeconomic History   Marital status:  Married    Spouse name: Not on file   Number of children: Not on file   Years of education: Not on file   Highest education level: Not on file  Occupational History   Not on file  Tobacco Use   Smoking status: Never   Smokeless tobacco: Never  Vaping Use   Vaping Use: Never used  Substance and Sexual Activity   Alcohol use: Not Currently   Drug use: Never   Sexual activity: Not Currently  Other Topics Concern   Not on file  Social History Narrative   Not on file   Social Determinants of Health   Financial Resource Strain: Not on file  Food Insecurity: Not on file  Transportation Needs: Not on file  Physical Activity: Not on file  Stress: Not on file  Social Connections: Not on file  Intimate Partner Violence: Not on file    Outpatient Medications Prior to Visit  Medication Sig Dispense Refill   ondansetron (ZOFRAN-ODT) 4 MG disintegrating tablet Allow 1 tablet to dissolve  by mouth every 6  hours as needed for nausea or vomiting. 20 tablet 0   pantoprazole (PROTONIX) 40 MG tablet Take 1 tablet  by mouth daily. 90 tablet 0   No facility-administered medications prior to visit.    Allergies  Allergen Reactions   Sulfa Antibiotics Rash    Review of Systems  Constitutional:  Negative for chills, fatigue and fever.  HENT:  Positive for congestion, postnasal drip, rhinorrhea, sinus pressure, sinus pain, sneezing and sore throat. Negative for ear pain.   Eyes:  Positive for itching.  Respiratory:  Positive for cough. Negative for shortness of breath.   Cardiovascular:  Negative for chest pain.  Gastrointestinal:  Negative for diarrhea and nausea.  Endocrine: Negative.   Genitourinary: Negative.   Musculoskeletal: Negative.   Allergic/Immunologic: Positive for environmental allergies.  Neurological:  Positive for dizziness and headaches.  Hematological: Negative.   Psychiatric/Behavioral: Negative.        Objective:    Physical Exam Vitals reviewed.  HENT:      Nose: Congestion and rhinorrhea present.     Mouth/Throat:     Pharynx: Posterior oropharyngeal erythema present.  Skin:    General: Skin is warm and dry.     Capillary Refill: Capillary refill takes less than 2 seconds.  Neurological:     General: No focal deficit present.     Mental Status: She is alert and oriented to person, place, and time.  Psychiatric:        Mood and Affect: Mood normal.        Behavior: Behavior normal.   BP 132/70    Pulse 62    Temp (!) 97.4 F (36.3 C)    Ht 5' 2.5" (1.588 m)    Wt 189 lb (85.7 kg)    SpO2 96%    BMI 34.02 kg/m     Health Maintenance Due  Topic Date Due   HIV Screening  Never done   Hepatitis C Screening  Never done   Zoster Vaccines- Shingrix (1 of 2) Never done   COVID-19 Vaccine (3 - Booster for Moderna series) 11/10/2019   MAMMOGRAM  01/03/2020   INFLUENZA VACCINE  11/10/2020   Fecal DNA (Cologuard)  04/07/2021       Lab Results  Component Value Date   TSH 2.760 01/06/2021   Lab Results  Component Value Date   WBC 6.0 01/06/2021   HGB 13.1 01/06/2021   HCT 39.3 01/06/2021   MCV 90 01/06/2021   PLT 195 01/06/2021   Lab Results  Component Value Date   NA 140 01/06/2021   K 4.1 01/06/2021   CO2 22 01/06/2021   GLUCOSE 84 01/06/2021   BUN 21 01/06/2021   CREATININE 0.93 01/06/2021   BILITOT 0.5 01/06/2021   ALKPHOS 73 01/06/2021   AST 27 01/06/2021   ALT 25 01/06/2021   PROT 7.0 01/06/2021   ALBUMIN 4.4 01/06/2021   CALCIUM 9.4 01/06/2021   ANIONGAP 8 11/28/2020   EGFR 73 01/06/2021   Lab Results  Component Value Date   CHOL 165 05/01/2020   Lab Results  Component Value Date   HDL 52 05/01/2020   Lab Results  Component Value Date   LDLCALC 95 05/01/2020   Lab Results  Component Value Date   TRIG 96 05/01/2020   Lab Results  Component Value Date   CHOLHDL 3.2 05/01/2020   Lab Results  Component Value Date   HGBA1C 5.6 10/09/2020       Assessment & Plan:   1. Chronic sinusitis,  unspecified location - triamcinolone acetonide (KENALOG-40) injection 60 mg - azithromycin (ZITHROMAX) 250 MG tablet; Take 2 tablets on day 1, then 1 tablet daily on days 2 through 5  Dispense: 6 tablet; Refill: 0 - fluticasone (FLONASE) 50 MCG/ACT nasal spray; Place 2 sprays into both nostrils daily.  Dispense: 16 g;  Refill: 6  2. Acute cough - POC COVID-19 BinaxNow-negative - POCT Influenza A/B-negative        Follow-up: PRN  An After Visit Summary was printed and given to the patient.  I, Rip Harbour, NP, have reviewed all documentation for this visit. The documentation on 05/05/21 for the exam, diagnosis, procedures, and orders are all accurate and complete.    Signed, Rip Harbour, NP [place] [phone]

## 2021-05-05 ENCOUNTER — Encounter: Payer: Self-pay | Admitting: Nurse Practitioner

## 2021-05-05 ENCOUNTER — Ambulatory Visit: Payer: BC Managed Care – PPO | Admitting: Nurse Practitioner

## 2021-05-05 VITALS — BP 132/70 | HR 62 | Temp 97.4°F | Ht 62.5 in | Wt 189.0 lb

## 2021-05-05 DIAGNOSIS — R051 Acute cough: Secondary | ICD-10-CM

## 2021-05-05 DIAGNOSIS — J329 Chronic sinusitis, unspecified: Secondary | ICD-10-CM

## 2021-05-05 LAB — POCT INFLUENZA A/B
Influenza A, POC: NEGATIVE
Influenza B, POC: NEGATIVE

## 2021-05-05 LAB — POC COVID19 BINAXNOW: SARS Coronavirus 2 Ag: NEGATIVE

## 2021-05-05 MED ORDER — TRIAMCINOLONE ACETONIDE 40 MG/ML IJ SUSP
60.0000 mg | Freq: Once | INTRAMUSCULAR | Status: AC
  Administered 2021-05-05: 10:00:00 60 mg via INTRAMUSCULAR

## 2021-05-05 MED ORDER — AZITHROMYCIN 250 MG PO TABS: ORAL_TABLET | ORAL | 0 refills | Status: AC

## 2021-05-05 MED ORDER — FLUTICASONE PROPIONATE 50 MCG/ACT NA SUSP: 2.0000 | Freq: Every day | NASAL | 6 refills | Status: DC

## 2021-05-06 LAB — HM MAMMOGRAPHY

## 2021-05-07 ENCOUNTER — Encounter: Payer: BC Managed Care – PPO | Attending: Surgery | Admitting: Skilled Nursing Facility1

## 2021-05-07 ENCOUNTER — Other Ambulatory Visit: Payer: Self-pay

## 2021-05-07 DIAGNOSIS — E669 Obesity, unspecified: Secondary | ICD-10-CM | POA: Insufficient documentation

## 2021-05-07 NOTE — Progress Notes (Signed)
Bariatric Nutrition Follow-Up Visit Medical Nutrition Therapy    NUTRITION ASSESSMENT    Surgery date: 12/09/2020 Surgery type: RYGB Start weight at [facility]: 255.7 pounds weight today: 187.6 pounds   Body Composition Scale 12/23/2020 02/05/2021 05/07/2021  Current Body Weight 230.5 217.3 187.6  Total Body Fat % 43.9 43.8 40.1  Visceral Fat 15 15 12   Fat-Free Mass % 56 56.1 59.8   Total Body Water % 42.5 42.5 44.4  Muscle-Mass lbs 30.8 29.2 28.6  BMI 38.9 39.2 34.1  Body Fat Displacement            Torso  lbs 62.7 59 46.4         Left Leg  lbs 12.5 11.8 9.2         Right Leg  lbs 12.5 11.8 9.2         Left Arm  lbs 6.2 5.9 4.6         Right Arm   lbs 6.2 5.9 4.6   Clinical  Medical hx: migraine, GERD, sleep apnea, HTN, IBS Medications: see list Labs:  Notable signs/symptoms: headaches Any previous deficiencies? No   Lifestyle & Dietary Hx  Pt states she is pretty tired usually.  Pt states she did have some blood sugars in the 60's.  Pt states she had a 2 events of felt food coming up when she laid down.  Pt states she typically Eats in front of the tv.   Estimated daily fluid intake:  40 oz Estimated daily protein intake: 60+ g Supplements: multi and calcium Current average weekly physical activity: ADL's   24-Hr Dietary Recall First Meal: protein shake + yogurt Snack:  Parmesan crisp Second Meal: chicken and squash  Snack:  Third Meal: chicken and cheese or arbys roast beef Snack: sugar free jello Beverages: water, Hint water, coffee + protein shake or powder creamer  Post-Op Goals/ Signs/ Symptoms Using straws: no Drinking while eating: no Chewing/swallowing difficulties: no Changes in vision: no Changes to mood/headaches: no Hair loss/changes to skin/nails: no Difficulty focusing/concentrating: no Sweating: no Limb weakness: no Dizziness/lightheadedness: no Palpitations: no  Carbonated/caffeinated beverages: no N/V/D/C/Gas: no Abdominal pain:  no Dumping syndrome: no    NUTRITION DIAGNOSIS  Overweight/obesity (Spencer-3.3) related to past poor dietary habits and physical inactivity as evidenced by completed bariatric surgery and following dietary guidelines for continued weight loss and healthy nutrition status.     NUTRITION INTERVENTION Nutrition counseling (C-1) and education (E-2) to facilitate bariatric surgery goals, including: Diet advancement to the next phase (phase 4) now including non starchy  The importance of consuming adequate calories as well as certain nutrients daily due to the body's need for essential vitamins, minerals, and fats The importance of daily physical activity and to reach a goal of at least 150 minutes of moderate to vigorous physical activity weekly (or as directed by their physician) due to benefits such as increased musculature and improved lab values The importance of intuitive eating specifically learning hunger-satiety cues and understanding the importance of learning a new body: The importance of mindful eating to avoid grazing behaviors  Importance of vegetables To have an overall healthy diet, adult men and women are recommended to consume anywhere from 2-3 cups of vegetables daily. Vegetables provide a wide range of vitamins and minerals such as vitamin A, vitamin C, potassium, and folic acid. According to the [facility], including fruit and vegetables daily may reduce the risk of cardiovascular disease, certain cancers, and other non-communicable diseases. Encouraged patient to honor  their body's internal hunger and fullness cues.  Throughout the day, check in mentally and rate hunger. Stop eating when satisfied not full regardless of how much food is left on the plate.  Get more if still hungry 20-30 minutes later.  The key is to honor satisfaction so throughout the meal, rate fullness factor and stop when comfortably satisfied not physically full. The key is to honor hunger and fullness  without any feelings of guilt or shame.  Pay attention to what the internal cues are, rather than any external factors. This will enhance the confidence you have in listening to your own body and following those internal cues enabling you to increase how often you eat when you are hungry not out of appetite and stop when you are satisfied not full.  Encouraged pt to continue to eat balanced meals inclusive of non starchy vegetables 2 times a day 7 days a week Encouraged pt to choose lean protein sources: limiting beef, pork, sausage, hotdogs, and lunch meat Encourage pt to choose healthy fats such as plant based limiting animal fats Encouraged pt to continue to drink a minium 64 fluid ounces with half being plain water to satisfy proper hydration   Goals: -quarter to set an alarm to drink  -increase your vairety of lean proteins, complex carbohydrates, and non starchy vegetables  -eat at the table -aim for non starchy vegetables 2 times  day 7 days a week  Handouts Provided Include  Phase 5  Learning Style & Readiness for Change Teaching method utilized: Visual & Auditory  Demonstrated degree of understanding via: Teach Back  Readiness Level: Action Barriers to learning/adherence to lifestyle change: none identified   RD's Notes for Next Visit Assess adherence to pt chosen goals    MONITORING & EVALUATION Dietary intake, weekly physical activity, body weight  Next Steps Patient is to follow-up

## 2021-05-11 ENCOUNTER — Other Ambulatory Visit: Payer: Self-pay | Admitting: Family Medicine

## 2021-05-11 ENCOUNTER — Other Ambulatory Visit: Payer: Self-pay

## 2021-05-11 MED ORDER — AMOXICILLIN-POT CLAVULANATE 875-125 MG PO TABS: 1.0000 | ORAL_TABLET | Freq: Two times a day (BID) | ORAL | 0 refills | Status: DC

## 2021-05-11 NOTE — Telephone Encounter (Signed)
Debbie Stark called to report that she is feeling worse.  She continues to have headache and sinus pressure.  Dr. Sedalia Muta is going to fax Augmentin.

## 2021-06-12 ENCOUNTER — Other Ambulatory Visit: Payer: BC Managed Care – PPO

## 2021-07-08 ENCOUNTER — Encounter: Payer: BC Managed Care – PPO | Attending: Surgery | Admitting: Skilled Nursing Facility1

## 2021-07-08 DIAGNOSIS — E669 Obesity, unspecified: Secondary | ICD-10-CM | POA: Insufficient documentation

## 2021-07-08 NOTE — Progress Notes (Signed)
Bariatric Nutrition Follow-Up Visit ?Medical Nutrition Therapy  ? ? ?NUTRITION ASSESSMENT ?  ? ?Surgery date: 12/09/2020 ?Surgery type: RYGB ?Start weight at [facility]: 255.7 pounds ?weight today: 176.8 pounds ?  ?Body Composition Scale 12/23/2020 02/05/2021 05/07/2021 07/08/2021  ?Current Body Weight 230.5 217.3 187.6 176.8  ?Total Body Fat % 43.9 43.8 40.1 38.3  ?Visceral Fat 15 15 12 11   ?Fat-Free Mass % 56 56.1 59.8 61.6  ? Total Body Water % 42.5 42.5 44.4 45.3  ?Muscle-Mass lbs 30.8 29.2 28.6 28.5  ?BMI 38.9 39.2 34.1 32.1  ?Body Fat Displacement      ?       Torso  lbs 62.7 59 46.4 41.9  ?       Left Leg  lbs 12.5 11.8 9.2 8.3  ?       Right Leg  lbs 12.5 11.8 9.2 8.3  ?       Left Arm  lbs 6.2 5.9 4.6 4.1  ?       Right Arm   lbs 6.2 5.9 4.6 4.4  ? ?Clinical  ?Medical hx: migraine, GERD, sleep apnea, HTN, IBS ?Medications: see list ?Labs:  ?Notable signs/symptoms: headaches ?Any previous deficiencies? No ?  ?Lifestyle & Dietary Hx ? ?Pt states she retires in 2 months and is excited to sun on her newly built deck.  ?Pt states her energy has been much better.  ?Pt states she eats 2 prunes a day.  ? ?Pt states she realizes now eating throughout the day is neccessary for appropriate weight management as well as energy level.  ? ?Pt states her and her husband have been Getting out for day trips to increase activity.  ? ?Pt states her weight goal is 140 lbs ? ?Estimated daily fluid intake:  +64 oz ?Estimated daily protein intake: 60+ g ?Supplements: multi and calcium ?Current average weekly physical activity: walking 20 minutes day ? ?24-Hr Dietary Recall ?      coffee ?First Meal: yogurt + fruit ?Snack:  Parmesan crisp ?Second Meal: beef + beans + onions + rice ?Snack: nuts ?Third Meal: 1 spoon peanut butter + vanilla wafers ?Snack: sugar free jello 2 prunes ?Beverages: water, Hint water, coffee + protein shake or powder creamer, cirkl bottle water ? ?Post-Op Goals/ Signs/ Symptoms ?Using straws: no ?Drinking while  eating: no ?Chewing/swallowing difficulties: no ?Changes in vision: no ?Changes to mood/headaches: no ?Hair loss/changes to skin/nails: no ?Difficulty focusing/concentrating: no ?Sweating: no ?Limb weakness: no ?Dizziness/lightheadedness: no ?Palpitations: no  ?Carbonated/caffeinated beverages: no ?N/V/D/C/Gas: no ?Abdominal pain: no ?Dumping syndrome: no ? ?  ?NUTRITION DIAGNOSIS  ?Overweight/obesity ([place]-3.3) related to past poor dietary habits and physical inactivity as evidenced by completed bariatric surgery and following dietary guidelines for continued weight loss and healthy nutrition status. ?  ?  ?NUTRITION INTERVENTION ?Nutrition counseling (C-1) and education (E-2) to facilitate bariatric surgery goals, including: ?Diet advancement to all foods added back in. ?The importance of consuming adequate calories as well as certain nutrients daily due to the body's need for essential vitamins, minerals, and fats ?The importance of daily physical activity and to reach a goal of at least 150 minutes of moderate to vigorous physical activity weekly (or as directed by their physician) due to benefits such as increased musculature and improved lab values ?The importance of intuitive eating specifically learning hunger-satiety cues and understanding the importance of learning a new body: The importance of mindful eating to avoid grazing behaviors  ?Importance of vegetables ?To have an overall healthy diet,  adult men and women are recommended to consume anywhere from 2-3 cups of vegetables daily. Vegetables provide a wide range of vitamins and minerals such as vitamin A, vitamin C, potassium, and folic acid. According to the [facility] [employer], including fruit and vegetables daily may reduce the risk of cardiovascular disease, certain cancers, and other non-communicable diseases. ?Encouraged patient to honor their body's internal hunger and fullness cues.  Throughout the day, check in mentally and rate hunger.  Stop eating when satisfied not full regardless of how much food is left on the plate.  Get more if still hungry 20-30 minutes later.  The key is to honor satisfaction so throughout the meal, rate fullness factor and stop when comfortably satisfied not physically full. The key is to honor hunger and fullness without any feelings of guilt or shame.  Pay attention to what the internal cues are, rather than any external factors. This will enhance the confidence you have in listening to your own body and following those internal cues enabling you to increase how often you eat when you are hungry not out of appetite and stop when you are satisfied not full.  ?Encouraged pt to continue to eat balanced meals inclusive of non starchy vegetables 2 times a day 7 days a week ?Encouraged pt to choose lean protein sources: limiting beef, pork, sausage, hotdogs, and lunch meat ?Encourage pt to choose healthy fats such as plant based limiting animal fats ?Encouraged pt to continue to drink a minium 64 fluid ounces with half being plain water to satisfy proper hydration  ? ?Goals: ?-great job! ?-continue to eat throughout the day inclusive of complex carbohydrates, non starchy vegetables, and lean protein ? ?Handouts Provided Include  ? ? ?Learning Style & Readiness for Change ?Teaching method utilized: Visual & Auditory  ?Demonstrated degree of understanding via: Teach Back  ?Readiness Level: Action ?Barriers to learning/adherence to lifestyle change: none identified  ? ? ?MONITORING & EVALUATION ?Dietary intake, weekly physical activity, body weight ? ?Next Steps ?Patient is to follow-up as needed please call or email if any questions or concerns arise  ? ?

## 2021-08-25 ENCOUNTER — Ambulatory Visit: Payer: BC Managed Care – PPO | Admitting: Family Medicine

## 2021-08-25 VITALS — BP 124/78 | HR 64 | Temp 97.2°F | Resp 18 | Ht 62.5 in | Wt 173.0 lb

## 2021-08-25 DIAGNOSIS — R1011 Right upper quadrant pain: Secondary | ICD-10-CM

## 2021-08-25 DIAGNOSIS — R7989 Other specified abnormal findings of blood chemistry: Secondary | ICD-10-CM

## 2021-08-25 DIAGNOSIS — R1013 Epigastric pain: Secondary | ICD-10-CM

## 2021-08-25 MED ORDER — LINACLOTIDE 72 MCG PO CAPS: 72.0000 ug | ORAL_CAPSULE | Freq: Every day | ORAL | 1 refills | Status: DC

## 2021-08-25 NOTE — Patient Instructions (Addendum)
Linzess 72 mg once daily.  ? ?

## 2021-08-25 NOTE — Progress Notes (Signed)
Subjective:  Patient ID: Debbie Stark, female    DOB: 1965/09/10  Age: 56 y.o. MRN: [id number]  Chief Complaint  Patient presents with   Hospitalization Follow-up    Follow up Hospitalization  Patient was admitted to [facility] ER on 08/23/21 and discharged on 08/23/2021. She was treated for Epigastric pain/ RUQ pain. Given ntg patch helped initially. Given ntg sl which did not help.  Treatment for this included GI Cocktail and Fentanyl. Helped.  CBC normal, Cmp normal   She reports good compliance with treatment. She reports this condition is improved.    Current Outpatient Medications on File Prior to Visit  Medication Sig Dispense Refill   calcium carbonate (TUMS - DOSED IN MG ELEMENTAL CALCIUM) 500 MG chewable tablet Chew 1 tablet by mouth 3 (three) times daily.     fluticasone (FLONASE) 50 MCG/ACT nasal spray Place 2 sprays into both nostrils daily. 16 g 6   Multiple Vitamin (MULTIVITAMIN) tablet Take 1 tablet by mouth daily.     ondansetron (ZOFRAN-ODT) 4 MG disintegrating tablet Allow 1 tablet to dissolve  by mouth every 6  hours as needed for nausea or vomiting. 20 tablet 0   No current facility-administered medications on file prior to visit.   Past Medical History:  Diagnosis Date   Arthritis    lower back pain   Bradycardia 01/06/2021   Chronic bilateral low back pain without sciatica 11/06/2018   Last Assessment & Plan:  Formatting of this note might be different from the original. Will order plain films of the lumbar spine to further evaluate lumbar pathology. Pattern of pain does appear to be facet mediated, and discussed with patient that she would benefit from lumbar epidural steroid injections as well as medial branch blocks.  However, due to significant improvement in pain following    Class 2 severe obesity with serious comorbidity and body mass index (BMI) of 38.0 to 38.9 in adult [facility]) 07/29/2019   Fatigue 01/06/2021   GERD (gastroesophageal  reflux disease)    History of bariatric surgery 01/06/2021   Hypertension    Left arm pain 01/06/2021   Migraine with aura, intractable, without status migrainosus    Mixed hyperlipidemia 07/25/2019   OSA (obstructive sleep apnea) 01/06/2021   Dxd 08/2020. Report reviewed today.  Pt is compliant 29/30 days in the last month. The day she missed was when she was in the hospital. Benefits.   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    Family History  Problem Relation Age of Onset   Cancer Mother        endometrial   Diabetes Father    Hyperlipidemia Father    Hypertension Father    Heart disease Father    Stroke Father    Social History   Socioeconomic History   Marital status: Married    Spouse name: Not on file   Number of children: Not on file   Years of education: Not on file   Highest education level: Not on file  Occupational History   Not on file  Tobacco Use   Smoking status: Never   Smokeless tobacco: Never  Vaping Use   Vaping Use: Never used  Substance and Sexual Activity   Alcohol use: Not Currently   Drug use: Never   Sexual activity: Not Currently  Other Topics Concern   Not on file  Social History Narrative   Not on file  Social Determinants of Health   Financial Resource Strain: Not on file  Food Insecurity: Not on file  Transportation Needs: Not on file  Physical Activity: Not on file  Stress: Not on file  Social Connections: Not on file    Review of Systems  Constitutional:  Negative for appetite change, fatigue and fever.  HENT:  Negative for congestion, ear pain, sinus pressure and sore throat.   Respiratory:  Negative for cough, chest tightness, shortness of breath and wheezing.   Cardiovascular:  Negative for chest pain and palpitations.  Gastrointestinal:  Positive for constipation and nausea. Negative for abdominal pain, diarrhea and vomiting.       Abdominal bloating    Genitourinary:   Negative for dysuria and hematuria.  Musculoskeletal:  Negative for arthralgias, back pain, joint swelling and myalgias.  Skin:  Negative for rash.  Neurological:  Negative for dizziness, weakness and headaches.  Psychiatric/Behavioral:  Negative for dysphoric mood. The patient is not nervous/anxious.     Objective:  BP 124/78   Pulse 64   Temp (!) 97.2 F (36.2 C)   Resp 18   Ht 5' 2.5" (1.588 m)   Wt 173 lb (78.5 kg)   BMI 31.14 kg/m      08/25/2021    3:37 PM 07/08/2021    4:01 PM 05/07/2021    4:05 PM  BP/Weight  Systolic BP 124    Diastolic BP 78    Wt. (Lbs) 173 176.8 187.6  BMI 31.14 kg/m2 32.34 kg/m2 34.31 kg/m2    Physical Exam Vitals reviewed.  Constitutional:      Appearance: Normal appearance. She is normal weight.  Cardiovascular:     Rate and Rhythm: Normal rate and regular rhythm.     Pulses: Normal pulses.     Heart sounds: Normal heart sounds.  Pulmonary:     Effort: Pulmonary effort is normal.     Breath sounds: Normal breath sounds.  Abdominal:     General: Abdomen is flat. Bowel sounds are normal.     Palpations: Abdomen is soft.  Neurological:     Mental Status: She is alert and oriented to person, place, and time.  Psychiatric:        Mood and Affect: Mood normal.        Behavior: Behavior normal.    Diabetic Foot Exam - Simple   No data filed      Lab Results  Component Value Date   WBC 8.4 08/25/2021   HGB 13.6 08/25/2021   HCT 39.7 08/25/2021   PLT 217 08/25/2021   GLUCOSE 88 08/25/2021   CHOL 165 05/01/2020   TRIG 96 05/01/2020   HDL 52 05/01/2020   LDLCALC 95 05/01/2020   ALT 89 (H) 08/25/2021   AST 56 (H) 08/25/2021   NA 140 08/25/2021   K 4.2 08/25/2021   CL 98 08/25/2021   CREATININE 0.72 08/25/2021   BUN 18 08/25/2021   CO2 25 08/25/2021   TSH 2.760 01/06/2021   INR 1.0 07/31/2020   HGBA1C 5.6 10/09/2020      Assessment & Plan:   Problem List Items Addressed This Visit       Other   Epigastric pain -  Primary   Right upper quadrant abdominal pain    Check labs.       Elevated liver function tests    Check labs.       Relevant Orders   CBC with Differential/Platelet (Completed)   Comprehensive metabolic panel (Completed)  Acute Hep Panel & Hep B Surface Ab (Completed)  .  Meds ordered this encounter  Medications   linaclotide (LINZESS) 72 MCG capsule    Sig: Take 1 capsule (72 mcg total) by mouth daily before breakfast.    Dispense:  90 capsule    Refill:  1    Orders Placed This Encounter  Procedures   CBC with Differential/Platelet   Comprehensive metabolic panel   Acute Hep Panel & Hep B Surface Ab     Follow-up: No follow-ups on file.  An After Visit Summary was printed and given to the patient.  I,Lauren M Auman,acting as a scribe for Blane OharaKirsten Eldo Umanzor, MD.,have documented all relevant documentation on the behalf of Blane OharaKirsten Krupa Stege, MD,as directed by  Blane OharaKirsten Nysha Koplin, MD while in the presence of Blane OharaKirsten Monteen Toops, MD.    Blane OharaKirsten Vonya Ohalloran, MD [facility] [phone]

## 2021-08-26 LAB — CBC WITH DIFFERENTIAL/PLATELET
Basophils Absolute: 0 10*3/uL (ref 0.0–0.2)
Basos: 1 %
EOS (ABSOLUTE): 0.2 10*3/uL (ref 0.0–0.4)
Eos: 2 %
Hematocrit: 39.7 % (ref 34.0–46.6)
Hemoglobin: 13.6 g/dL (ref 11.1–15.9)
Immature Grans (Abs): 0 10*3/uL (ref 0.0–0.1)
Immature Granulocytes: 0 %
Lymphocytes Absolute: 2.9 10*3/uL (ref 0.7–3.1)
Lymphs: 34 %
MCH: 32.5 pg (ref 26.6–33.0)
MCHC: 34.3 g/dL (ref 31.5–35.7)
MCV: 95 fL (ref 79–97)
Monocytes Absolute: 0.5 10*3/uL (ref 0.1–0.9)
Monocytes: 6 %
Neutrophils Absolute: 4.7 10*3/uL (ref 1.4–7.0)
Neutrophils: 57 %
Platelets: 217 10*3/uL (ref 150–450)
RBC: 4.19 x10E6/uL (ref 3.77–5.28)
RDW: 12.1 % (ref 11.7–15.4)
WBC: 8.4 10*3/uL (ref 3.4–10.8)

## 2021-08-26 LAB — COMPREHENSIVE METABOLIC PANEL
ALT: 89 IU/L — ABNORMAL HIGH (ref 0–32)
AST: 56 IU/L — ABNORMAL HIGH (ref 0–40)
Albumin/Globulin Ratio: 2 (ref 1.2–2.2)
Albumin: 4.4 g/dL (ref 3.8–4.9)
Alkaline Phosphatase: 105 IU/L (ref 44–121)
BUN/Creatinine Ratio: 25 — ABNORMAL HIGH (ref 9–23)
BUN: 18 mg/dL (ref 6–24)
Bilirubin Total: 0.5 mg/dL (ref 0.0–1.2)
CO2: 25 mmol/L (ref 20–29)
Calcium: 9.4 mg/dL (ref 8.7–10.2)
Chloride: 98 mmol/L (ref 96–106)
Creatinine, Ser: 0.72 mg/dL (ref 0.57–1.00)
Globulin, Total: 2.2 g/dL (ref 1.5–4.5)
Glucose: 88 mg/dL (ref 70–99)
Potassium: 4.2 mmol/L (ref 3.5–5.2)
Sodium: 140 mmol/L (ref 134–144)
Total Protein: 6.6 g/dL (ref 6.0–8.5)
eGFR: 100 mL/min/{1.73_m2} (ref >59)

## 2021-08-26 LAB — ACUTE HEP PANEL AND HEP B SURFACE AB
Hep A IgM: NEGATIVE
Hep B C IgM: NEGATIVE
Hep C Virus Ab: NONREACTIVE
Hepatitis B Surf Ab Quant: <3.1 m[IU]/mL — ABNORMAL LOW (ref >9.9)
Hepatitis B Surface Ag: NEGATIVE

## 2021-08-30 DIAGNOSIS — R7989 Other specified abnormal findings of blood chemistry: Secondary | ICD-10-CM | POA: Insufficient documentation

## 2021-08-30 DIAGNOSIS — R1011 Right upper quadrant pain: Secondary | ICD-10-CM | POA: Insufficient documentation

## 2021-08-30 DIAGNOSIS — R1013 Epigastric pain: Secondary | ICD-10-CM | POA: Insufficient documentation

## 2021-08-30 NOTE — Assessment & Plan Note (Signed)
Check labs 

## 2021-09-08 ENCOUNTER — Encounter: Payer: Self-pay | Admitting: Family Medicine

## 2022-03-10 ENCOUNTER — Telehealth: Payer: Self-pay

## 2022-03-10 NOTE — Telephone Encounter (Signed)
Patient called to see if her referral has been done to see a sleep doctor?  Please advise

## 2022-03-12 NOTE — Telephone Encounter (Signed)
I will call her back to get more info on this referral she is asking about.

## 2022-03-29 ENCOUNTER — Other Ambulatory Visit: Payer: Self-pay | Admitting: Family Medicine

## 2022-03-29 ENCOUNTER — Encounter: Payer: Self-pay | Admitting: Family Medicine

## 2022-03-29 ENCOUNTER — Telehealth: Payer: Self-pay

## 2022-03-29 DIAGNOSIS — G4733 Obstructive sleep apnea (adult) (pediatric): Secondary | ICD-10-CM

## 2022-03-29 NOTE — Telephone Encounter (Signed)
Patient called to see if she could get a referral to a sleep doctor (Dr Carlos American in [place] ).  She's having problems with her machine.  Please advise

## 2022-03-29 NOTE — Telephone Encounter (Signed)
Done. Dr. Jamicheal Heard  

## 2022-06-02 ENCOUNTER — Encounter: Payer: Self-pay | Admitting: Family Medicine

## 2022-06-02 LAB — HM MAMMOGRAPHY

## 2022-07-05 ENCOUNTER — Other Ambulatory Visit: Payer: Self-pay

## 2022-07-05 MED ORDER — LINACLOTIDE 72 MCG PO CAPS: 72.0000 ug | ORAL_CAPSULE | Freq: Every day | ORAL | 0 refills | Status: DC

## 2022-07-07 ENCOUNTER — Ambulatory Visit (INDEPENDENT_AMBULATORY_CARE_PROVIDER_SITE_OTHER): Payer: BC Managed Care – PPO | Admitting: Family Medicine

## 2022-07-07 ENCOUNTER — Encounter: Payer: Self-pay | Admitting: Family Medicine

## 2022-07-07 VITALS — BP 136/80 | HR 82 | Temp 97.2°F | Ht 63.0 in | Wt 153.0 lb

## 2022-07-07 DIAGNOSIS — Z20822 Contact with and (suspected) exposure to covid-19: Secondary | ICD-10-CM

## 2022-07-07 DIAGNOSIS — J029 Acute pharyngitis, unspecified: Secondary | ICD-10-CM | POA: Diagnosis not present

## 2022-07-07 LAB — POC COVID19 BINAXNOW: SARS Coronavirus 2 Ag: NEGATIVE

## 2022-07-07 MED ORDER — AMOXICILLIN 875 MG PO TABS: 875.0000 mg | ORAL_TABLET | Freq: Two times a day (BID) | ORAL | 0 refills | Status: DC

## 2022-07-07 NOTE — Progress Notes (Signed)
Acute Office Visit  Subjective:    Patient ID: Debbie Stark, female    DOB: [birth year], 57 y.o.   MRN: [id number]  Chief Complaint  Patient presents with   Sore Throat    HPI: Patient is in today for sore throat x 5 days hurts the most at night and when she first gets up in the morning. Husband was positive for both strep and covid, son is positive for covid. Current symptoms started last Thursday.  Past Medical History:  Diagnosis Date   Arthritis    lower back pain   Bradycardia 01/06/2021   Chronic bilateral low back pain without sciatica 11/06/2018   Last Assessment & Plan:  Formatting of this note might be different from the original. Will order plain films of the lumbar spine to further evaluate lumbar pathology. Pattern of pain does appear to be facet mediated, and discussed with patient that she would benefit from lumbar epidural steroid injections as well as medial branch blocks.  However, due to significant improvement in pain following    Class 2 severe obesity with serious comorbidity and body mass index (BMI) of 38.0 to 38.9 in adult [facility]) 07/29/2019   Fatigue 01/06/2021   GERD (gastroesophageal reflux disease)    History of bariatric surgery 01/06/2021   Hypertension    Left arm pain 01/06/2021   Migraine with aura, intractable, without status migrainosus    Mixed hyperlipidemia 07/25/2019   OSA (obstructive sleep apnea) 01/06/2021   Dxd 08/2020. Report reviewed today.  Pt is compliant 29/30 days in the last month. The day she missed was when she was in the hospital. Benefits.    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    Family History  Problem Relation Age of Onset   Cancer Mother        endometrial   Diabetes Father    Hyperlipidemia Father    Hypertension Father    Heart disease Father    Stroke Father     Social History   Socioeconomic History   Marital status: Married    Spouse name: Not on file    Number of children: Not on file   Years of education: Not on file   Highest education level: Not on file  Occupational History   Not on file  Tobacco Use   Smoking status: Never   Smokeless tobacco: Never  Vaping Use   Vaping Use: Never used  Substance and Sexual Activity   Alcohol use: Not Currently   Drug use: Never   Sexual activity: Not Currently  Other Topics Concern   Not on file  Social History Narrative   Not on file   Social Determinants of Health   Financial Resource Strain: Low Risk  (07/07/2022)   Overall Financial Resource Strain (CARDIA)    Difficulty of Paying Living Expenses: Not hard at all  Food Insecurity: No Food Insecurity (07/07/2022)   Hunger Vital Sign    Worried About Running Out of Food in the Last Year: Never true    [place] in the Last Year: Never true  Transportation Needs: No Transportation Needs (07/07/2022)   PRAPARE - Hydrologist (Medical): No    Lack of Transportation (Non-Medical): No  Physical Activity: Inactive (07/07/2022)   Exercise Vital Sign    Days of Exercise per Week: 0 days    Minutes of Exercise per Session: 0 min  Stress:  No Stress Concern Present (07/07/2022)   [place]    Feeling of Stress : Not at all  Social Connections: Moderately Integrated (07/07/2022)   Social Connection and Isolation Panel [NHANES]    Frequency of Communication with Friends and Family: Three times a week    Frequency of Social Gatherings with Friends and Family: Three times a week    Attends Religious Services: More than 4 times per year    Active Member of Clubs or Organizations: No    Attends Archivist Meetings: Never    Marital Status: Married  Human resources officer Violence: Not At Risk (07/07/2022)   Humiliation, Afraid, Rape, and Kick questionnaire    Fear of Current or Ex-Partner: No    Emotionally Abused: No    Physically Abused: No     Sexually Abused: No    Outpatient Medications Prior to Visit  Medication Sig Dispense Refill   calcium carbonate (TUMS - DOSED IN MG ELEMENTAL CALCIUM) 500 MG chewable tablet Chew 1 tablet by mouth 3 (three) times daily.     fluticasone (FLONASE) 50 MCG/ACT nasal spray Place 2 sprays into both nostrils daily. 16 g 6   linaclotide (LINZESS) 72 MCG capsule Take 1 capsule (72 mcg total) by mouth daily before breakfast. 90 capsule 0   Multiple Vitamin (MULTIVITAMIN) tablet Take 1 tablet by mouth daily.     ondansetron (ZOFRAN-ODT) 4 MG disintegrating tablet Allow 1 tablet to dissolve  by mouth every 6  hours as needed for nausea or vomiting. 20 tablet 0   No facility-administered medications prior to visit.    Allergies  Allergen Reactions   Sulfa Antibiotics Rash    Review of Systems  Constitutional:  Negative for chills, fatigue and fever.  HENT:  Positive for ear pain and sore throat. Negative for congestion, postnasal drip, rhinorrhea, sinus pressure and sinus pain.   Respiratory:  Negative for cough and shortness of breath.   Gastrointestinal:  Negative for diarrhea and nausea.  Neurological:  Positive for headaches. Negative for dizziness.       Objective:        07/07/2022   11:16 AM 08/25/2021    3:37 PM 07/08/2021    4:01 PM  Vitals with BMI  Height 5\' 3"  5' 2.5" 5\' 2"   Weight 153 lbs 173 lbs 176 lbs 13 oz  BMI 27.11 [id number]  Systolic [id number]   Diastolic 80 78   Pulse 82 64     No data found.   Physical Exam Vitals reviewed.  Constitutional:      Appearance: Normal appearance. She is well-developed.  HENT:     Right Ear: Tympanic membrane, ear canal and external ear normal.     Left Ear: Tympanic membrane, ear canal and external ear normal.     Nose: Nose normal.     Mouth/Throat:     Pharynx: Oropharynx is clear. Posterior oropharyngeal erythema present. No oropharyngeal exudate.  Cardiovascular:     Rate and Rhythm: Normal rate and regular rhythm.      Heart sounds: Normal heart sounds. No murmur heard. Pulmonary:     Effort: Pulmonary effort is normal. No respiratory distress.     Breath sounds: Normal breath sounds.  Lymphadenopathy:     Cervical: No cervical adenopathy.  Neurological:     Mental Status: She is alert and oriented to person, place, and time.  Psychiatric:        Mood and Affect:  Mood normal.        Behavior: Behavior normal.     Health Maintenance Due  Topic Date Due   HIV Screening  Never done   Zoster Vaccines- Shingrix (1 of 2) Never done   Fecal DNA (Cologuard)  04/07/2021    There are no preventive care reminders to display for this patient.   Lab Results  Component Value Date   TSH 2.760 01/06/2021   Lab Results  Component Value Date   WBC 8.4 08/25/2021   HGB 13.6 08/25/2021   HCT 39.7 08/25/2021   MCV 95 08/25/2021   PLT 217 08/25/2021   Lab Results  Component Value Date   NA 140 08/25/2021   K 4.2 08/25/2021   CO2 25 08/25/2021   GLUCOSE 88 08/25/2021   BUN 18 08/25/2021   CREATININE 0.72 08/25/2021   BILITOT 0.5 08/25/2021   ALKPHOS 105 08/25/2021   AST 56 (H) 08/25/2021   ALT 89 (H) 08/25/2021   PROT 6.6 08/25/2021   ALBUMIN 4.4 08/25/2021   CALCIUM 9.4 08/25/2021   ANIONGAP 8 11/28/2020   EGFR 100 08/25/2021   Lab Results  Component Value Date   CHOL 165 05/01/2020   Lab Results  Component Value Date   HDL 52 05/01/2020   Lab Results  Component Value Date   LDLCALC 95 05/01/2020   Lab Results  Component Value Date   TRIG 96 05/01/2020   Lab Results  Component Value Date   CHOLHDL 3.2 05/01/2020   Lab Results  Component Value Date   HGBA1C 5.6 10/09/2020       Assessment & Plan:  Acute pharyngitis, unspecified etiology -     POC COVID-19 BinaxNow -     Amoxicillin; Take 1 tablet (875 mg total) by mouth 2 (two) times daily.  Dispense: 14 tablet; Refill: 0  Contact with and (suspected) exposure to covid-19 -     POC COVID-19 BinaxNow     Meds  ordered this encounter  Medications   amoxicillin (AMOXIL) 875 MG tablet    Sig: Take 1 tablet (875 mg total) by mouth 2 (two) times daily.    Dispense:  14 tablet    Refill:  0    Orders Placed This Encounter  Procedures   POC COVID-19 BinaxNow     Follow-up: Return if symptoms worsen or fail to improve.  An After Visit Summary was printed and given to the patient.  Rochel Brome, MD [facility] [phone]

## 2022-07-07 NOTE — Patient Instructions (Addendum)
Gargle with salt water. Prescription: amoxicillin.

## 2022-07-27 ENCOUNTER — Ambulatory Visit (INDEPENDENT_AMBULATORY_CARE_PROVIDER_SITE_OTHER): Payer: BC Managed Care – PPO | Admitting: Family Medicine

## 2022-07-27 ENCOUNTER — Encounter: Payer: Self-pay | Admitting: Family Medicine

## 2022-07-27 VITALS — BP 132/60 | HR 72 | Temp 97.3°F | Resp 14 | Ht 63.0 in | Wt 152.0 lb

## 2022-07-27 DIAGNOSIS — S60562A Insect bite (nonvenomous) of left hand, initial encounter: Secondary | ICD-10-CM | POA: Diagnosis not present

## 2022-07-27 DIAGNOSIS — R600 Localized edema: Secondary | ICD-10-CM | POA: Diagnosis not present

## 2022-07-27 DIAGNOSIS — T7840XA Allergy, unspecified, initial encounter: Secondary | ICD-10-CM | POA: Insufficient documentation

## 2022-07-27 DIAGNOSIS — W57XXXA Bitten or stung by nonvenomous insect and other nonvenomous arthropods, initial encounter: Secondary | ICD-10-CM | POA: Diagnosis not present

## 2022-07-27 MED ORDER — TRIAMCINOLONE ACETONIDE 40 MG/ML IJ SUSP
80.0000 mg | Freq: Once | INTRAMUSCULAR | Status: AC
  Administered 2022-07-27: 80 mg via INTRAMUSCULAR

## 2022-07-27 MED ORDER — DOXYCYCLINE HYCLATE 100 MG PO TABS: 100.0000 mg | ORAL_TABLET | Freq: Two times a day (BID) | ORAL | 0 refills | Status: DC

## 2022-07-27 MED ORDER — PREDNISONE 10 MG PO TABS: ORAL_TABLET | ORAL | 0 refills | Status: AC

## 2022-07-27 NOTE — Progress Notes (Signed)
Acute Office Visit  Subjective:    Patient ID: Debbie Stark, female    DOB: [birth year], 57 y.o.   MRN: [id number]  Chief Complaint  Patient presents with   Belepharitis    Bilateral L>R    HPI: Patient is in today for bilateral swelling and pain of her eyes.  She worked in her yard yesterday and her symptoms started about 2 hours later.  She started with blurred vision from her swelling and then redness and pain.  She tried benadryl at bedtime which has not  improved the swelling.  She also has itching and swelling from a possible insect left wrist.    Past Medical History:  Diagnosis Date   Arthritis    lower back pain   Bradycardia 01/06/2021   Chronic bilateral low back pain without sciatica 11/06/2018   Last Assessment & Plan:  Formatting of this note might be different from the original. Will order plain films of the lumbar spine to further evaluate lumbar pathology. Pattern of pain does appear to be facet mediated, and discussed with patient that she would benefit from lumbar epidural steroid injections as well as medial branch blocks.  However, due to significant improvement in pain following    Class 2 severe obesity with serious comorbidity and body mass index (BMI) of 38.0 to 38.9 in adult 07/29/2019   Fatigue 01/06/2021   GERD (gastroesophageal reflux disease)    History of bariatric surgery 01/06/2021   Hypertension    Left arm pain 01/06/2021   Migraine with aura, intractable, without status migrainosus    Mixed hyperlipidemia 07/25/2019   OSA (obstructive sleep apnea) 01/06/2021   Dxd 08/2020. Report reviewed today.  Pt is compliant 29/30 days in the last month. The day she missed was when she was in the hospital. Benefits.    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    Family History  Problem Relation Age of Onset   Cancer Mother        endometrial   Diabetes Father    Hyperlipidemia Father    Hypertension Father     Heart disease Father    Stroke Father     Social History   Socioeconomic History   Marital status: Married    Spouse name: Not on file   Number of children: Not on file   Years of education: Not on file   Highest education level: Not on file  Occupational History   Not on file  Tobacco Use   Smoking status: Never   Smokeless tobacco: Never  Vaping Use   Vaping Use: Never used  Substance and Sexual Activity   Alcohol use: Not Currently   Drug use: Never   Sexual activity: Not Currently  Other Topics Concern   Not on file  Social History Narrative   Not on file   Social Determinants of Health   Financial Resource Strain: Low Risk  (07/07/2022)   Overall Financial Resource Strain (CARDIA)    Difficulty of Paying Living Expenses: Not hard at all  Food Insecurity: No Food Insecurity (07/07/2022)   Hunger Vital Sign    Worried About Running Out of Food in the Last Year: Never true    Ran Out of Food in the Last Year: Never true  Transportation Needs: No Transportation Needs (07/07/2022)   PRAPARE - Administrator, Civil Service (Medical): No    Lack of Transportation (Non-Medical): No  Physical  Activity: Inactive (07/07/2022)   Exercise Vital Sign    Days of Exercise per Week: 0 days    Minutes of Exercise per Session: 0 min  Stress: No Stress Concern Present (07/07/2022)   [employer] of Occupational Health - Occupational Stress Questionnaire    Feeling of Stress : Not at all  Social Connections: Moderately Integrated (07/07/2022)   Social Connection and Isolation Panel [NHANES]    Frequency of Communication with Friends and Family: Three times a week    Frequency of Social Gatherings with Friends and Family: Three times a week    Attends Religious Services: More than 4 times per year    Active Member of Clubs or Organizations: No    Attends Banker Meetings: Never    Marital Status: Married  Catering manager Violence: Not At Risk  (07/07/2022)   Humiliation, Afraid, Rape, and Kick questionnaire    Fear of Current or Ex-Partner: No    Emotionally Abused: No    Physically Abused: No    Sexually Abused: No    Outpatient Medications Prior to Visit  Medication Sig Dispense Refill   BLACK COHOSH PO Take by mouth.     calcium carbonate (TUMS - DOSED IN MG ELEMENTAL CALCIUM) 500 MG chewable tablet Chew 1 tablet by mouth 3 (three) times daily.     fluticasone (FLONASE) 50 MCG/ACT nasal spray Place 2 sprays into both nostrils daily. 16 g 6   linaclotide (LINZESS) 72 MCG capsule Take 1 capsule (72 mcg total) by mouth daily before breakfast. 90 capsule 0   Multiple Vitamin (MULTIVITAMIN) tablet Take 1 tablet by mouth daily.     ondansetron (ZOFRAN-ODT) 4 MG disintegrating tablet Allow 1 tablet to dissolve  by mouth every 6  hours as needed for nausea or vomiting. 20 tablet 0   amoxicillin (AMOXIL) 875 MG tablet Take 1 tablet (875 mg total) by mouth 2 (two) times daily. 14 tablet 0   No facility-administered medications prior to visit.    Allergies  Allergen Reactions   Sulfa Antibiotics Rash    Review of Systems  Constitutional:  Negative for chills and fever.  HENT:  Negative for congestion.        Eye pain and swelling bilaterally.  Left greater than the right.    Respiratory:  Negative for cough.   Cardiovascular:  Negative for chest pain.  Gastrointestinal:  Positive for constipation.  Musculoskeletal:  Positive for neck pain.  Skin:        Redness and swelling left wrist.    Neurological:  Positive for headaches.  Psychiatric/Behavioral:  Negative for dysphoric mood. The patient is not nervous/anxious.        Objective:        07/27/2022    9:14 AM 07/07/2022   11:16 AM 08/25/2021    3:37 PM  Vitals with BMI  Height 5\' 3"  5\' 3"  5' 2.5"  Weight 152 lbs 153 lbs 173 lbs  BMI 26.93 27.11 31.12  Systolic 132 136 010  Diastolic 60 80 78  Pulse 72 82 64    No data found.   Physical Exam Vitals  reviewed.  Constitutional:      Appearance: Normal appearance. She is normal weight.  Eyes:     General:        Left eye: No discharge.     Comments: Periorbital edema (left>right)  Cardiovascular:     Rate and Rhythm: Normal rate and regular rhythm.     Heart sounds: Normal  heart sounds.  Pulmonary:     Effort: Pulmonary effort is normal. No respiratory distress.     Breath sounds: Normal breath sounds.  Skin:    Findings: Lesion (insect bites x 3 on left hand) present.  Neurological:     Mental Status: She is alert and oriented to person, place, and time.  Psychiatric:        Mood and Affect: Mood normal.        Behavior: Behavior normal.     Health Maintenance Due  Topic Date Due   HIV Screening  Never done   Zoster Vaccines- Shingrix (1 of 2) Never done   Fecal DNA (Cologuard)  04/07/2021   COVID-19 Vaccine (3 - 2023-24 season) 12/11/2021    There are no preventive care reminders to display for this patient.   Lab Results  Component Value Date   TSH 2.760 01/06/2021   Lab Results  Component Value Date   WBC 8.4 08/25/2021   HGB 13.6 08/25/2021   HCT 39.7 08/25/2021   MCV 95 08/25/2021   PLT 217 08/25/2021   Lab Results  Component Value Date   NA 140 08/25/2021   K 4.2 08/25/2021   CO2 25 08/25/2021   GLUCOSE 88 08/25/2021   BUN 18 08/25/2021   CREATININE 0.72 08/25/2021   BILITOT 0.5 08/25/2021   ALKPHOS 105 08/25/2021   AST 56 (H) 08/25/2021   ALT 89 (H) 08/25/2021   PROT 6.6 08/25/2021   ALBUMIN 4.4 08/25/2021   CALCIUM 9.4 08/25/2021   ANIONGAP 8 11/28/2020   EGFR 100 08/25/2021   Lab Results  Component Value Date   CHOL 165 05/01/2020   Lab Results  Component Value Date   HDL 52 05/01/2020   Lab Results  Component Value Date   LDLCALC 95 05/01/2020   Lab Results  Component Value Date   TRIG 96 05/01/2020   Lab Results  Component Value Date   CHOLHDL 3.2 05/01/2020   Lab Results  Component Value Date   HGBA1C 5.6 10/09/2020        Assessment & Plan:  Periorbital edema of both eyes -     Triamcinolone Acetonide -     predniSONE; Take 6 tablets (60 mg total) by mouth daily with breakfast for 1 day, THEN 5 tablets (50 mg total) daily with breakfast for 1 day, THEN 4 tablets (40 mg total) daily with breakfast for 1 day, THEN 3 tablets (30 mg total) daily with breakfast for 1 day, THEN 2 tablets (20 mg total) daily with breakfast for 1 day, THEN 1 tablet (10 mg total) daily with breakfast for 1 day.  Dispense: 21 tablet; Refill: 0  Allergy, initial encounter -     predniSONE; Take 6 tablets (60 mg total) by mouth daily with breakfast for 1 day, THEN 5 tablets (50 mg total) daily with breakfast for 1 day, THEN 4 tablets (40 mg total) daily with breakfast for 1 day, THEN 3 tablets (30 mg total) daily with breakfast for 1 day, THEN 2 tablets (20 mg total) daily with breakfast for 1 day, THEN 1 tablet (10 mg total) daily with breakfast for 1 day.  Dispense: 21 tablet; Refill: 0  Insect bite of left hand, initial encounter -     Doxycycline Hyclate; Take 1 tablet (100 mg total) by mouth 2 (two) times daily.  Dispense: 20 tablet; Refill: 0     Meds ordered this encounter  Medications   doxycycline (VIBRA-TABS) 100 MG tablet  Sig: Take 1 tablet (100 mg total) by mouth 2 (two) times daily.    Dispense:  20 tablet    Refill:  0   triamcinolone acetonide (KENALOG-40) injection 80 mg   predniSONE (DELTASONE) 10 MG tablet    Sig: Take 6 tablets (60 mg total) by mouth daily with breakfast for 1 day, THEN 5 tablets (50 mg total) daily with breakfast for 1 day, THEN 4 tablets (40 mg total) daily with breakfast for 1 day, THEN 3 tablets (30 mg total) daily with breakfast for 1 day, THEN 2 tablets (20 mg total) daily with breakfast for 1 day, THEN 1 tablet (10 mg total) daily with breakfast for 1 day.    Dispense:  21 tablet    Refill:  0    No orders of the defined types were placed in this encounter.    Follow-up: Return  if symptoms worsen or fail to improve.  An After Visit Summary was printed and given to the patient.  Blane Ohara, MD [facility] [phone]

## 2022-07-27 NOTE — Patient Instructions (Addendum)
Pred pack as directed.  Doxycycline as directed.  Recommend otc antihistamine (zyrtec, claritin or allegra.)  Kenalog shot given.

## 2022-10-30 IMAGING — DX DG CHEST 2V
2 series · 2 of 2 positions shown · non-contrast
Comparison: None.

CLINICAL DATA: Preoperative evaluation.

EXAM:
CHEST - 2 VIEW

[chest pa]
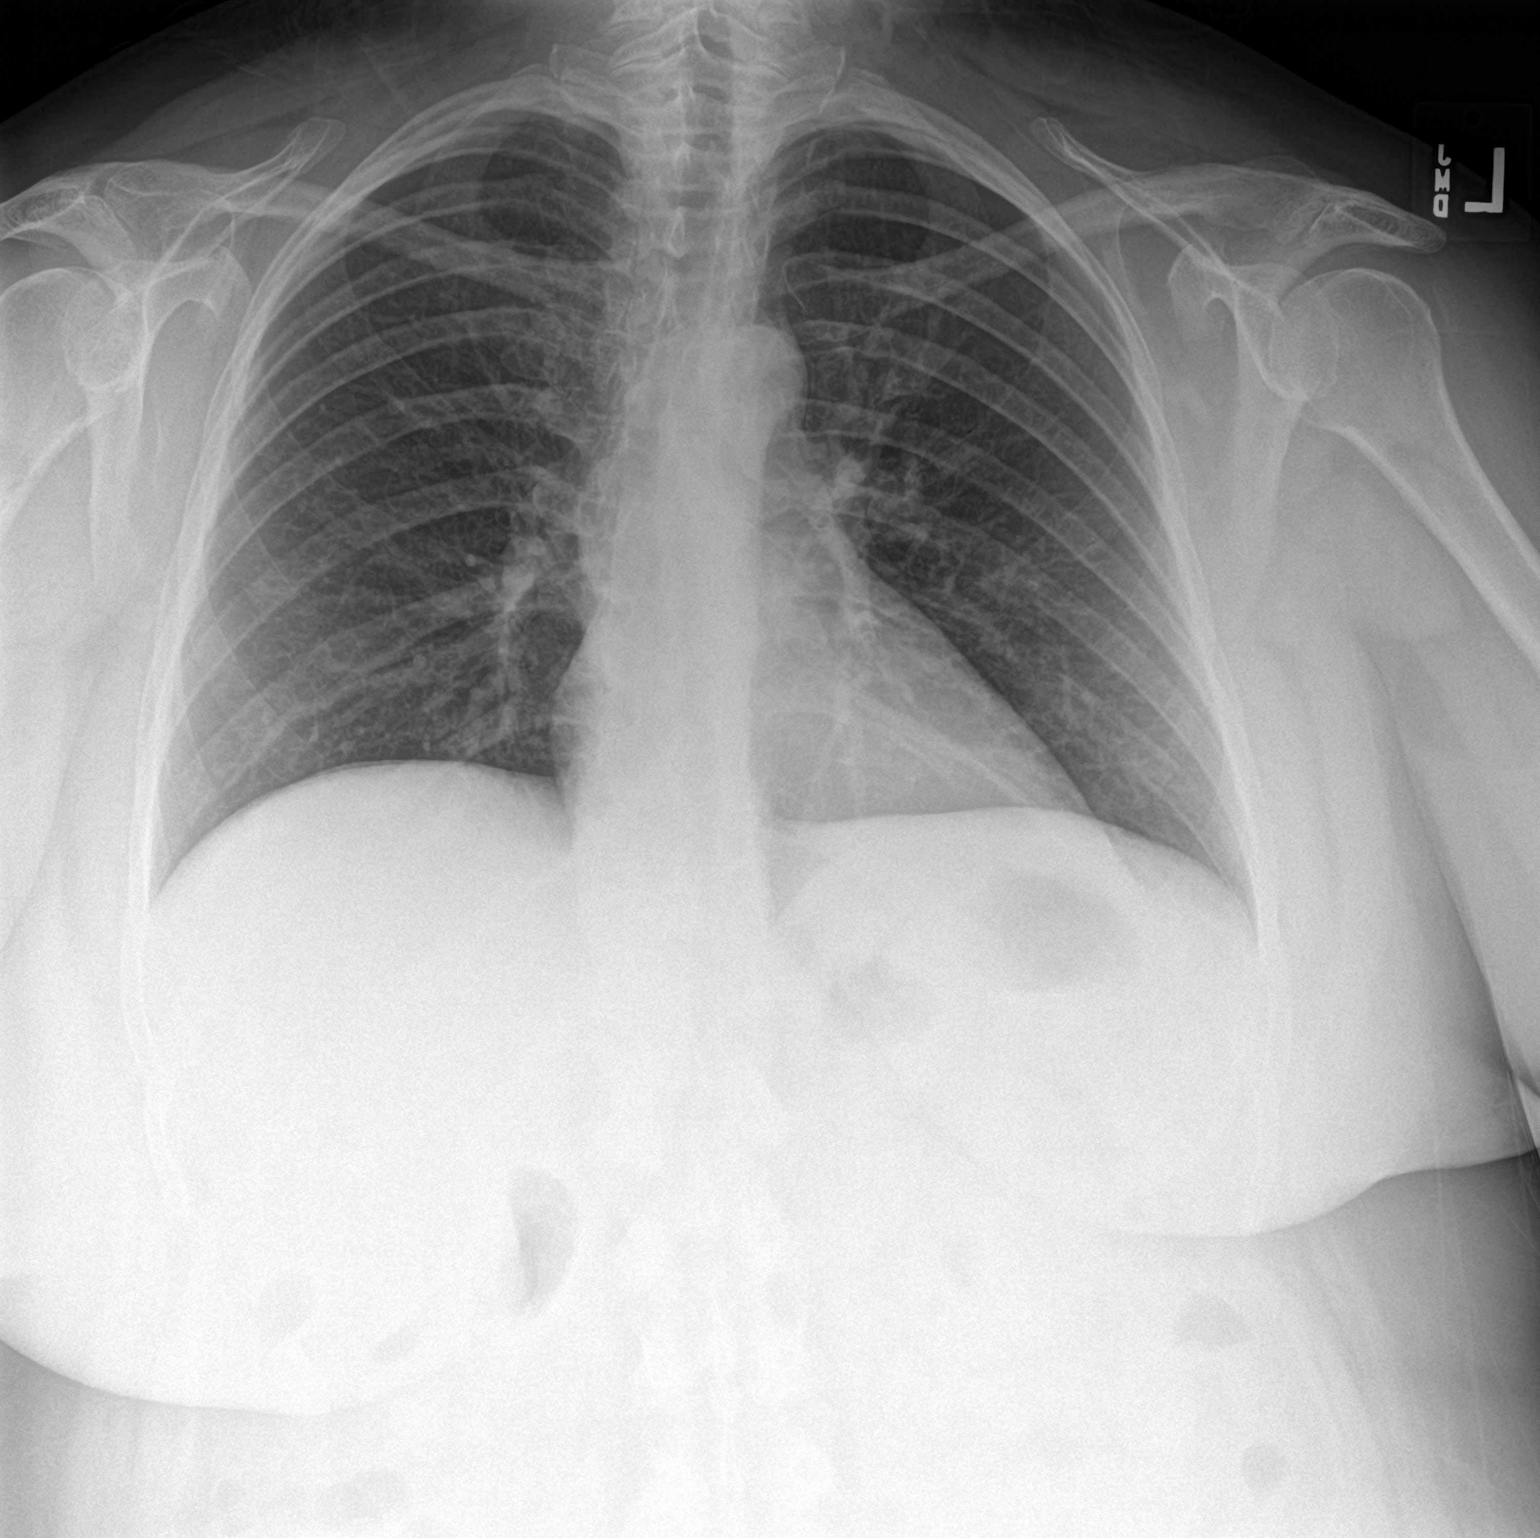

[chest lat]
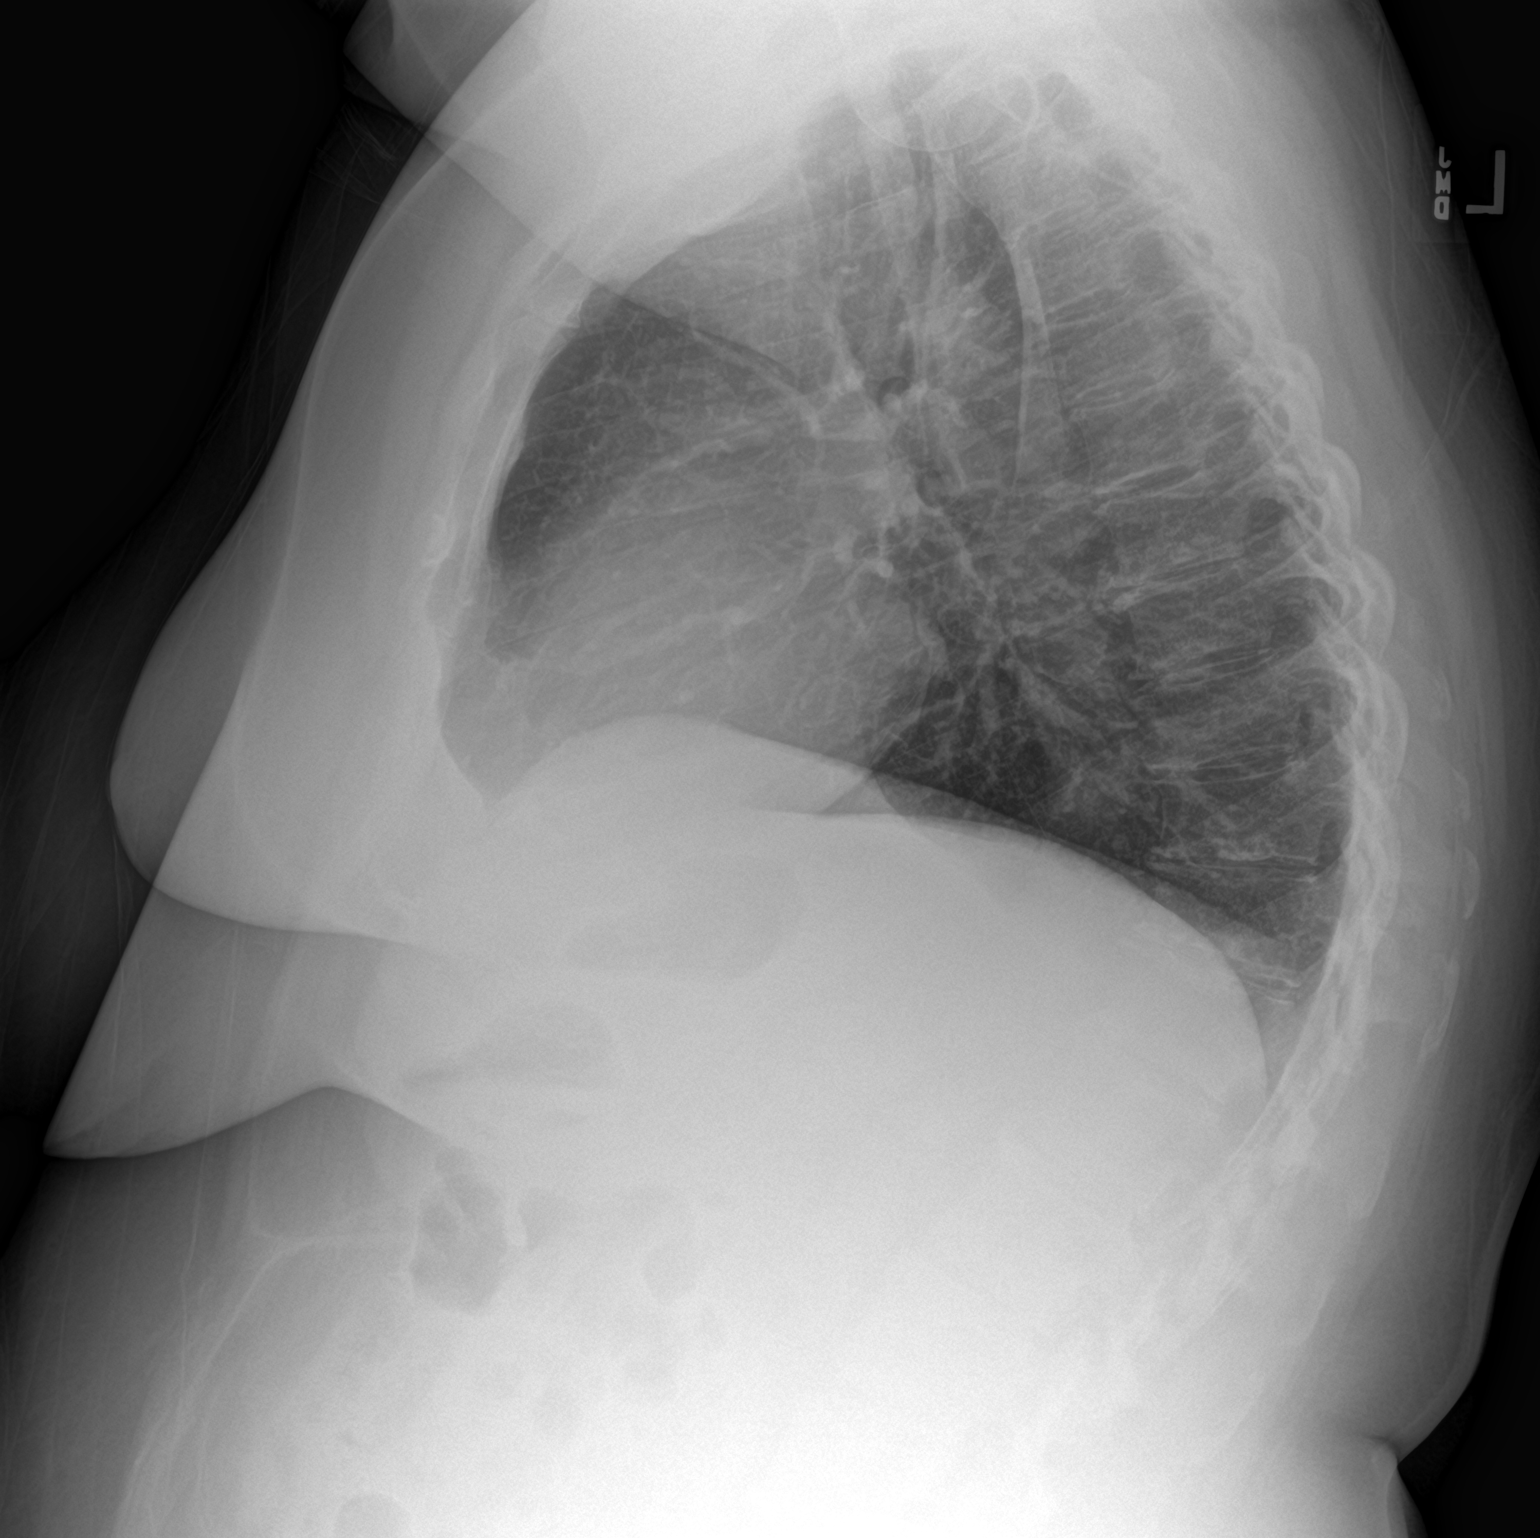

[2 of 2 positions shown; findings below may reference images not displayed]

FINDINGS: The heart size and mediastinal contours are within normal limits.
Both lungs are clear. The visualized skeletal structures are
unremarkable.
IMPRESSION: No active cardiopulmonary disease.

## 2022-11-03 ENCOUNTER — Encounter: Payer: Self-pay | Admitting: Family Medicine

## 2022-11-03 ENCOUNTER — Ambulatory Visit: Payer: BC Managed Care – PPO | Admitting: Family Medicine

## 2022-11-03 VITALS — BP 130/70 | HR 72 | Temp 96.1°F | Resp 18 | Ht 62.0 in | Wt 146.0 lb

## 2022-11-03 DIAGNOSIS — I10 Essential (primary) hypertension: Secondary | ICD-10-CM

## 2022-11-03 DIAGNOSIS — K909 Intestinal malabsorption, unspecified: Secondary | ICD-10-CM

## 2022-11-03 DIAGNOSIS — E782 Mixed hyperlipidemia: Secondary | ICD-10-CM | POA: Diagnosis not present

## 2022-11-03 DIAGNOSIS — Z114 Encounter for screening for human immunodeficiency virus [HIV]: Secondary | ICD-10-CM | POA: Insufficient documentation

## 2022-11-03 DIAGNOSIS — R7301 Impaired fasting glucose: Secondary | ICD-10-CM

## 2022-11-03 DIAGNOSIS — Z9884 Bariatric surgery status: Secondary | ICD-10-CM

## 2022-11-03 DIAGNOSIS — R233 Spontaneous ecchymoses: Secondary | ICD-10-CM

## 2022-11-03 NOTE — Assessment & Plan Note (Signed)
Currently taking no medication. Healthy diet and exercise.

## 2022-11-03 NOTE — Progress Notes (Unsigned)
Subjective:  Patient ID: Debbie Stark, female    DOB: 05-05-1965  Age: 57 y.o. MRN: [id number]  Chief Complaint  Patient presents with   Medical Management of Chronic Issues    HPI Hyperlipidemia:  healthy diet and exercise. Hx bariatric surgery:  doing well with surgery. Total weight loss of  105 lbs.   Eats small amounts more frequently.  Energy level is improved.   Hand cramps:       11/03/2022    9:31 AM 07/07/2022   11:18 AM 09/03/2020    2:20 PM 07/31/2020    4:10 PM 07/29/2019    7:46 PM  Depression screen PHQ 2/9  Decreased Interest 0 0 0 0 0  Down, Depressed, Hopeless 0 0 0 0 0  PHQ - 2 Score 0 0 0 0 0  Altered sleeping 1      Tired, decreased energy 0      Change in appetite 0      Feeling bad or failure about yourself  0      Trouble concentrating 0      Moving slowly or fidgety/restless 0      Suicidal thoughts 0      PHQ-9 Score 1      Difficult doing work/chores Not difficult at all            11/03/2022    9:31 AM  Fall Risk   Falls in the past year? 0  Number falls in past yr: 0  Injury with Fall? 0  Risk for fall due to : No Fall Risks  Follow up Falls evaluation completed;Falls prevention discussed    Patient Care Team: Neisha Hinger, Fritzi Mandes, MD as PCP - General (Family Medicine)   Review of Systems  Constitutional:  Negative for chills, fatigue and fever.  HENT:  Negative for congestion, ear pain, rhinorrhea and sore throat.   Respiratory:  Negative for cough and shortness of breath.   Cardiovascular:  Negative for chest pain.  Gastrointestinal:  Positive for constipation (controlled with prn linzess). Negative for abdominal pain, diarrhea and vomiting.  Genitourinary:  Negative for dysuria and urgency.  Musculoskeletal:  Positive for myalgias (hand cramps). Negative for arthralgias and back pain.  Neurological:  Negative for dizziness, weakness, light-headedness and headaches.  Psychiatric/Behavioral:  Negative for dysphoric mood. The patient is not  nervous/anxious.     Current Outpatient Medications on File Prior to Visit  Medication Sig Dispense Refill   Biotin 800 MCG TABS Take 800 tablets by mouth daily.     Fexofenadine HCl (ALLER-EASE PO) Take by mouth daily.     BLACK COHOSH PO Take by mouth.     calcium carbonate (TUMS - DOSED IN MG ELEMENTAL CALCIUM) 500 MG chewable tablet Chew 1 tablet by mouth 3 (three) times daily.     fluticasone (FLONASE) 50 MCG/ACT nasal spray Place 2 sprays into both nostrils daily. 16 g 6   linaclotide (LINZESS) 72 MCG capsule Take 1 capsule (72 mcg total) by mouth daily before breakfast. 90 capsule 0   Multiple Vitamin (MULTIVITAMIN) tablet Take 1 tablet by mouth daily.     ondansetron (ZOFRAN-ODT) 4 MG disintegrating tablet Allow 1 tablet to dissolve  by mouth every 6  hours as needed for nausea or vomiting. 20 tablet 0   No current facility-administered medications on file prior to visit.   Past Medical History:  Diagnosis Date   Arthritis    lower back pain   Bradycardia 01/06/2021   Chronic bilateral  low back pain without sciatica 11/06/2018   Last Assessment & Plan:  Formatting of this note might be different from the original. Will order plain films of the lumbar spine to further evaluate lumbar pathology. Pattern of pain does appear to be facet mediated, and discussed with patient that she would benefit from lumbar epidural steroid injections as well as medial branch blocks.  However, due to significant improvement in pain following    Class 2 severe obesity with serious comorbidity and body mass index (BMI) of 38.0 to 38.9 in adult [facility]) 07/29/2019   Fatigue 01/06/2021   GERD (gastroesophageal reflux disease)    History of bariatric surgery 01/06/2021   Hypertension    Left arm pain 01/06/2021   Migraine with aura, intractable, without status migrainosus    Mixed hyperlipidemia 07/25/2019   OSA (obstructive sleep apnea) 01/06/2021   Dxd 08/2020. Report reviewed today.  Pt is compliant 29/30 days  in the last month. The day she missed was when she was in the hospital. Benefits.   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    Family History  Problem Relation Age of Onset   Cancer Mother        endometrial   Diabetes Father    Hyperlipidemia Father    Hypertension Father    Heart disease Father    Stroke Father    Social History   Socioeconomic History   Marital status: Married    Spouse name: Not on file   Number of children: Not on file   Years of education: Not on file   Highest education level: Not on file  Occupational History   Not on file  Tobacco Use   Smoking status: Never   Smokeless tobacco: Never  Vaping Use   Vaping status: Never Used  Substance and Sexual Activity   Alcohol use: Not Currently   Drug use: Never   Sexual activity: Not Currently  Other Topics Concern   Not on file  Social History Narrative   Not on file   Social Determinants of Health   Financial Resource Strain: Low Risk  (07/07/2022)   Overall Financial Resource Strain (CARDIA)    Difficulty of Paying Living Expenses: Not hard at all  Food Insecurity: No Food Insecurity (07/07/2022)   Hunger Vital Sign    Worried About Running Out of Food in the Last Year: Never true    Ran Out of Food in the Last Year: Never true  Transportation Needs: No Transportation Needs (07/07/2022)   PRAPARE - Administrator, Civil Service (Medical): No    Lack of Transportation (Non-Medical): No  Physical Activity: Inactive (07/07/2022)   Exercise Vital Sign    Days of Exercise per Week: 0 days    Minutes of Exercise per Session: 0 min  Stress: No Stress Concern Present (07/07/2022)   [employer] of Occupational Health - Occupational Stress Questionnaire    Feeling of Stress : Not at all  Social Connections: Moderately Integrated (07/07/2022)   Social Connection and Isolation Panel [NHANES]    Frequency of Communication with Friends and  Family: Three times a week    Frequency of Social Gatherings with Friends and Family: Three times a week    Attends Religious Services: More than 4 times per year    Active Member of Clubs or Organizations: No    Attends Banker Meetings: Never    Marital Status: Married  Objective:  BP 130/70   Pulse 72   Temp (!) 96.1 F (35.6 C)   Resp 18   Ht 5\' 2"  (1.575 m)   Wt 146 lb (66.2 kg)   BMI 26.70 kg/m      11/03/2022    9:23 AM 07/27/2022    9:14 AM 07/07/2022   11:16 AM  BP/Weight  Systolic BP 130 132 136  Diastolic BP 70 60 80  Wt. (Lbs) 146 152 153  BMI 26.7 kg/m2 26.93 kg/m2 27.1 kg/m2    Physical Exam Vitals reviewed.  Constitutional:      Appearance: Normal appearance. She is normal weight.  Neck:     Vascular: No carotid bruit.  Cardiovascular:     Rate and Rhythm: Normal rate and regular rhythm.     Heart sounds: Normal heart sounds.  Pulmonary:     Effort: Pulmonary effort is normal. No respiratory distress.     Breath sounds: Normal breath sounds.  Abdominal:     General: Abdomen is flat. Bowel sounds are normal.     Palpations: Abdomen is soft.     Tenderness: There is no abdominal tenderness.  Neurological:     Mental Status: She is alert and oriented to person, place, and time.  Psychiatric:        Mood and Affect: Mood normal.        Behavior: Behavior normal.     Diabetic Foot Exam - Simple   No data filed      Lab Results  Component Value Date   WBC 5.1 11/03/2022   HGB 13.2 11/03/2022   HCT 40.6 11/03/2022   PLT 224 11/03/2022   GLUCOSE 87 11/03/2022   CHOL 217 (H) 11/03/2022   TRIG 74 11/03/2022   HDL 74 11/03/2022   LDLCALC 130 (H) 11/03/2022   ALT 35 (H) 11/03/2022   AST 42 (H) 11/03/2022   NA 141 11/03/2022   K 5.1 11/03/2022   CL 101 11/03/2022   CREATININE 0.84 11/03/2022   BUN 17 11/03/2022   CO2 24 11/03/2022   TSH 2.760 01/06/2021   INR 1.0 11/03/2022   HGBA1C 5.2 11/03/2022      Assessment &  Plan:    Hypertension, unspecified type Assessment & Plan: Currently taking no medication. Healthy diet and exercise.     Impaired fasting glucose Assessment & Plan: Hemoglobin A1c 5.6, 3 month avg of blood sugars, is in prediabetic range.  In order to prevent progression to diabetes, recommend low carb diet and regular exercise   Orders: -     APTT -     Hemoglobin A1c  Malabsorption syndrome Assessment & Plan: Continue with healthy diet and exercise. Labs drawn today.    Orders: -     Magnesium -     Phosphorus -     Vitamin B12 -     VITAMIN D 25 Hydroxy (Vit-D Deficiency, Fractures)  Mixed hyperlipidemia Assessment & Plan: Healthy diet and exercise.    Orders: -     Lipid panel  Spontaneous bruising Assessment & Plan: Lab draw today.  Orders: -     Protime-INR -     CBC with Differential/Platelet -     CMP14+EGFR  History of bariatric surgery Assessment & Plan: Continue healthy diet and exercise. Lab draw today.      Encounter for screening for HIV Assessment & Plan: Order labs  Orders: -     HIV Antibody (routine testing w rflx)     No  orders of the defined types were placed in this encounter.   Orders Placed This Encounter  Procedures   Magnesium   Phosphorus   Protime-INR   CBC with Differential/Platelet   CMP14+EGFR   Vitamin B12   APTT   Lipid panel   Hemoglobin A1c   HIV Antibody (routine testing w rflx)   Vitamin D, 25-hydroxy     Follow-up: No follow-ups on file.   I,Carolyn M Morrison,acting as a Neurosurgeon for Blane Ohara, MD.,have documented all relevant documentation on the behalf of Blane Ohara, MD,as directed by  Blane Ohara, MD while in the presence of Blane Ohara, MD.   Clayborn Bigness I Leal-Borjas,acting as a scribe for Blane Ohara, MD.,have documented all relevant documentation on the behalf of Blane Ohara, MD,as directed by  Blane Ohara, MD while in the presence of Blane Ohara, MD.    An After Visit Summary was printed  and given to the patient.  Blane Ohara, MD [facility] [phone]

## 2022-11-03 NOTE — Assessment & Plan Note (Signed)
Continue healthy diet and exercise. Lab draw today.

## 2022-11-03 NOTE — Assessment & Plan Note (Signed)
Lab draw today

## 2022-11-03 NOTE — Assessment & Plan Note (Signed)
Continue with healthy diet and exercise. Labs drawn today.

## 2022-11-03 NOTE — Assessment & Plan Note (Signed)
Healthy diet and exercise.  

## 2022-11-04 LAB — LIPID PANEL
Cholesterol, Total: 217 mg/dL — ABNORMAL HIGH (ref 100–199)
HDL: 74 mg/dL (ref >39)
LDL Chol Calc (NIH): 130 mg/dL — ABNORMAL HIGH (ref 0–99)
Triglycerides: 74 mg/dL (ref 0–149)
VLDL Cholesterol Cal: 13 mg/dL (ref 5–40)

## 2022-11-04 LAB — CMP14+EGFR
ALT: 35 IU/L — ABNORMAL HIGH (ref 0–32)
AST: 42 IU/L — ABNORMAL HIGH (ref 0–40)
Alkaline Phosphatase: 80 IU/L (ref 44–121)
BUN/Creatinine Ratio: 20 (ref 9–23)
BUN: 17 mg/dL (ref 6–24)
Bilirubin Total: 0.7 mg/dL (ref 0.0–1.2)
CO2: 24 mmol/L (ref 20–29)
Calcium: 9.7 mg/dL (ref 8.7–10.2)
Chloride: 101 mmol/L (ref 96–106)
Creatinine, Ser: 0.84 mg/dL (ref 0.57–1.00)
Glucose: 87 mg/dL (ref 70–99)
Potassium: 5.1 mmol/L (ref 3.5–5.2)
Sodium: 141 mmol/L (ref 134–144)
Total Protein: 6.5 g/dL (ref 6.0–8.5)
eGFR: 83 mL/min/{1.73_m2} (ref >59)

## 2022-11-04 LAB — CBC WITH DIFFERENTIAL/PLATELET
Basophils Absolute: 0 10*3/uL (ref 0.0–0.2)
Basos: 0 %
EOS (ABSOLUTE): 0.2 10*3/uL (ref 0.0–0.4)
Eos: 3 %
Hematocrit: 40.6 % (ref 34.0–46.6)
Hemoglobin: 13.2 g/dL (ref 11.1–15.9)
Immature Grans (Abs): 0 10*3/uL (ref 0.0–0.1)
Lymphocytes Absolute: 2.1 10*3/uL (ref 0.7–3.1)
Lymphs: 42 %
MCHC: 32.5 g/dL (ref 31.5–35.7)
MCV: 98 fL — ABNORMAL HIGH (ref 79–97)
Monocytes Absolute: 0.3 10*3/uL (ref 0.1–0.9)
Monocytes: 7 %
Neutrophils Absolute: 2.4 10*3/uL (ref 1.4–7.0)
Neutrophils: 48 %
Platelets: 224 10*3/uL (ref 150–450)
RBC: 4.16 x10E6/uL (ref 3.77–5.28)
RDW: 11.9 % (ref 11.7–15.4)
WBC: 5.1 10*3/uL (ref 3.4–10.8)

## 2022-11-04 LAB — VITAMIN B12: Vitamin B-12: 881 pg/mL (ref 232–1245)

## 2022-11-04 LAB — PHOSPHORUS: Phosphorus: 3.6 mg/dL (ref 3.0–4.3)

## 2022-11-04 LAB — VITAMIN D 25 HYDROXY (VIT D DEFICIENCY, FRACTURES): Vit D, 25-Hydroxy: 43 ng/mL (ref 30.0–100.0)

## 2022-11-04 LAB — HEMOGLOBIN A1C
Est. average glucose Bld gHb Est-mCnc: 103 mg/dL
Hgb A1c MFr Bld: 5.2 % (ref 4.8–5.6)

## 2022-11-04 LAB — APTT: aPTT: 27 s (ref 24–33)

## 2022-11-04 LAB — HIV ANTIBODY (ROUTINE TESTING W REFLEX): HIV Screen 4th Generation wRfx: NONREACTIVE

## 2022-11-04 LAB — PROTIME-INR: INR: 1 (ref 0.9–1.2)

## 2022-11-06 NOTE — Assessment & Plan Note (Signed)
Hemoglobin A1c 5.6%, 3 month avg of blood sugars, is in prediabetic range.  In order to prevent progression to diabetes, recommend low carb diet and regular exercise  

## 2022-11-06 NOTE — Assessment & Plan Note (Signed)
Order labs.

## 2022-11-08 ENCOUNTER — Encounter: Payer: Self-pay | Admitting: Family Medicine

## 2023-05-05 ENCOUNTER — Telehealth: Payer: Self-pay

## 2023-05-05 NOTE — Telephone Encounter (Signed)
Copied from CRM [id number]. Topic: General - Other >> May 04, 2023  3:59 PM Prudencio Pair wrote: Reason for CRM: Patient called stating that she's starting a new job soon & she's currently completing paperwork. She stated that one of the questions is asking about her Hep B vaccinations. Pt states she's not sure when she had the series, but she thinks it was about 6 years ago. Patient would like for a nurse to see when was the actual dates & give her a call to let her know this information. Looked in immunization summary under Misc Rpts tab & did not see the Hep B series listed at all. CB #: [id number].

## 2023-05-05 NOTE — Telephone Encounter (Signed)
Called patient informed her that I do not see documentation of hep B series in Epic, [place] or our old EMR. Patient states she positive she has had them, advised her that she could have lab work drawn to test for antibodies and if this is something she needs or wants done to call our office back for an appt. Patient verbalized understanding.

## 2023-06-16 LAB — HM MAMMOGRAPHY

## 2023-06-20 ENCOUNTER — Encounter: Payer: Self-pay | Admitting: Family Medicine

## 2023-06-21 ENCOUNTER — Encounter: Payer: Self-pay | Admitting: Family Medicine

## 2023-07-15 ENCOUNTER — Encounter (HOSPITAL_COMMUNITY): Payer: Self-pay | Admitting: *Deleted

## 2023-12-19 NOTE — Progress Notes (Signed)
 Subjective:  Patient ID: Debbie Stark, female    DOB: Jul 11, 1965  Age: 58 y.o. MRN: [id number]  Chief Complaint  Patient presents with   Medical Management of Chronic Issues   Discussed the use of AI scribe software for clinical note transcription with the patient, who gave verbal consent to proceed.  History of Present Illness   Debbie Stark is a 58 year old female who presents for a follow-up visit after bariatric surgery.  Post-bariatric surgery gastrointestinal symptoms - Underwent bariatric surgery three years ago - Experiences stomach pain or discomfort if eating too quickly or without proper chewing - Uses Pepto-Bismol as needed; frequency of use varies weekly - Takes bariatric multivitamin, biotin, and Tums for calcium  supplementation - Recently identified lack of iron in vitamins; now takes 45 mg iron once weekly  Vasomotor symptoms - Experiences hot flashes that disturb sleep - Has tried black cohosh and a food store product with inconsistent effectiveness - Has not used hormone therapy due to concerns about side effects  Headache and visual changes - Occasional headaches upon waking, relieved by Tylenol  and coffee - Recently evaluated by eye care provider and prescribed glasses for farsightedness, primarily for reading  Hypertension - History of high blood pressure  Migraine - History of migraines  Obstructive sleep apnea - History of sleep apnea - No longer uses CPAP machine  Gastroesophageal reflux disease - History of acid reflux  Functional status and lifestyle - Maintains an active lifestyle with yard work and physical activities on nice days - 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           11/03/2022    9:31 AM 07/07/2022   11:18 AM 09/03/2020    2:20 PM 07/31/2020    4:10 PM 07/29/2019    7:46 PM  Depression screen PHQ 2/9  Decreased Interest 0 0 0 0 0  Down, Depressed, Hopeless 0 0 0 0 0  PHQ - 2 Score 0 0 0 0 0   Altered sleeping 1      Tired, decreased energy 0      Change in appetite 0      Feeling bad or failure about yourself  0      Trouble concentrating 0      Moving slowly or fidgety/restless 0      Suicidal thoughts 0      PHQ-9 Score 1      Difficult doing work/chores Not difficult at all            11/03/2022    9:31 AM  Fall Risk   Falls in the past year? 0  Number falls in past yr: 0  Injury with Fall? 0  Risk for fall due to : No Fall Risks  Follow up Falls evaluation completed;Falls prevention discussed    Patient Care Team: Bettymae Yott, Abigail, MD as PCP - General (Family Medicine)   Review of Systems  Constitutional:  Negative for chills, fatigue and fever.  HENT:  Negative for congestion, ear pain and sore throat.   Respiratory:  Negative for cough and shortness of breath.   Cardiovascular:  Negative for chest pain.  Gastrointestinal:  Negative for abdominal pain, constipation, diarrhea, nausea and vomiting.  Genitourinary:  Negative for dysuria and urgency.  Musculoskeletal:  Negative for arthralgias and myalgias.  Skin:  Negative for rash.  Neurological:  Positive for headaches. Negative for dizziness.  Psychiatric/Behavioral:  Negative for dysphoric mood. The patient is not nervous/anxious.  Current Outpatient Medications on File Prior to Visit  Medication Sig Dispense Refill   Biotin 800 MCG TABS Take 800 tablets by mouth daily.     calcium  carbonate (TUMS - DOSED IN MG ELEMENTAL CALCIUM ) 500 MG chewable tablet Chew 1 tablet by mouth 3 (three) times daily.     Multiple Vitamin (MULTIVITAMIN) tablet Take 1 tablet by mouth daily.     No current facility-administered medications on file prior to visit.   Past Medical History:  Diagnosis Date   Arthritis    lower back pain   Bradycardia 01/06/2021   Chronic bilateral low back pain without sciatica 11/06/2018   Last Assessment & Plan:  Formatting of this note might be different from the original. Will order plain  films of the lumbar spine to further evaluate lumbar pathology. Pattern of pain does appear to be facet mediated, and discussed with patient that she would benefit from lumbar epidural steroid injections as well as medial branch blocks.  However, due to significant improvement in pain following    Class 2 severe obesity with serious comorbidity and body mass index (BMI) of 38.0 to 38.9 in adult [facility]) 07/29/2019   Fatigue 01/06/2021   GERD (gastroesophageal reflux disease)    History of bariatric surgery 01/06/2021   Hypertension    Left arm pain 01/06/2021   Migraine with aura, intractable, without status migrainosus    Mixed hyperlipidemia 07/25/2019   OSA (obstructive sleep apnea) 01/06/2021   Dxd 08/2020. Report reviewed today.  Pt is compliant 29/30 days in the last month. The day she missed was when she was in the hospital. Benefits.   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    Family History  Problem Relation Age of Onset   Cancer Mother        endometrial   Diabetes Father    Hyperlipidemia Father    Hypertension Father    Heart disease Father    Stroke Father    Social History   Socioeconomic History   Marital status: Married    Spouse name: Not on file   Number of children: Not on file   Years of education: Not on file   Highest education level: Not on file  Occupational History   Not on file  Tobacco Use   Smoking status: Never   Smokeless tobacco: Never  Vaping Use   Vaping status: Never Used  Substance and Sexual Activity   Alcohol use: Not Currently   Drug use: Never   Sexual activity: Not Currently  Other Topics Concern   Not on file  Social History Narrative   Not on file   Social Drivers of Health   Financial Resource Strain: Low Risk  (07/07/2022)   Overall Financial Resource Strain (CARDIA)    Difficulty of Paying Living Expenses: Not hard at all  Food Insecurity: No Food Insecurity (12/20/2023)    Hunger Vital Sign    Worried About Running Out of Food in the Last Year: Never true    Ran Out of Food in the Last Year: Never true  Transportation Needs: No Transportation Needs (12/20/2023)   PRAPARE - Administrator, Civil Service (Medical): No    Lack of Transportation (Non-Medical): No  Physical Activity: Inactive (07/07/2022)   Exercise Vital Sign    Days of Exercise per Week: 0 days    Minutes of Exercise per Session: 0 min  Stress: No Stress Concern Present (07/07/2022)  [employer] of Occupational Health - Occupational Stress Questionnaire    Feeling of Stress : Not at all  Social Connections: Moderately Integrated (07/07/2022)   Social Connection and Isolation Panel    Frequency of Communication with Friends and Family: Three times a week    Frequency of Social Gatherings with Friends and Family: Three times a week    Attends Religious Services: More than 4 times per year    Active Member of Clubs or Organizations: No    Attends Banker Meetings: Never    Marital Status: Married    Objective:  BP 128/72   Pulse 76   Temp 98 F (36.7 C)   Ht 5' 2 (1.575 m)   Wt 155 lb (70.3 kg)   SpO2 98%   BMI 28.35 kg/m      12/20/2023    9:56 AM 11/03/2022    9:23 AM 07/27/2022    9:14 AM  BP/Weight  Systolic BP 128 130 132  Diastolic BP 72 70 60  Wt. (Lbs) 155 146 152  BMI 28.35 kg/m2 26.7 kg/m2 26.93 kg/m2    Physical Exam Vitals reviewed.  Constitutional:      Appearance: Normal appearance. She is normal weight.  Neck:     Vascular: No carotid bruit.  Cardiovascular:     Rate and Rhythm: Normal rate and regular rhythm.     Heart sounds: Normal heart sounds.  Pulmonary:     Effort: Pulmonary effort is normal. No respiratory distress.     Breath sounds: Normal breath sounds.  Abdominal:     General: Abdomen is flat. Bowel sounds are normal.     Palpations: Abdomen is soft.     Tenderness: There is no abdominal tenderness.   Neurological:     Mental Status: She is alert and oriented to person, place, and time.  Psychiatric:        Mood and Affect: Mood normal.        Behavior: Behavior normal.         Lab Results  Component Value Date   WBC 5.2 12/20/2023   HGB 13.1 12/20/2023   HCT 39.7 12/20/2023   PLT 218 12/20/2023   GLUCOSE 92 12/20/2023   CHOL 218 (H) 12/20/2023   TRIG 58 12/20/2023   HDL 71 12/20/2023   LDLCALC 137 (H) 12/20/2023   ALT 20 12/20/2023   AST 31 12/20/2023   NA 138 12/20/2023   K 4.8 12/20/2023   CL 101 12/20/2023   CREATININE 0.80 12/20/2023   BUN 14 12/20/2023   CO2 22 12/20/2023   TSH 1.880 12/20/2023   INR 1.0 11/03/2022   HGBA1C 5.0 12/20/2023      Assessment & Plan:  Hypertension, unspecified type Assessment & Plan: Currently taking no medication. Healthy diet and exercise.     Encounter for immunization -     Flu vaccine, recombinant, trivalent, inj  Impaired fasting glucose Assessment & Plan: Recommend continue to work on eating healthy diet and exercise. Check A1c.   Orders: -     Hemoglobin A1c  Malabsorption syndrome Assessment & Plan: Ongoing nutritional monitoring post-bariatric surgery with occasional stomach pain related to eating habits. - Continue bariatric multivitamins. - Continue biotin supplementation. - Continue Tums for calcium  supplementation. - Continue iron supplementation once a week until new vitamins are obtained. Continue with healthy diet and exercise. Labs drawn today.    Orders: -     B12 and Folate Panel -     Methylmalonic  acid, serum -     VITAMIN D  25 Hydroxy (Vit-D Deficiency, Fractures)  Mixed hyperlipidemia Assessment & Plan: Hyperlipidemia noted in previous labs. - Check cholesterol levels with blood work. Recommend continue to work on eating healthy diet and exercise.    Orders: -     TSH -     Lipid panel  History of bariatric surgery Assessment & Plan: Check labs.   OSA (obstructive  sleep apnea) Assessment & Plan: Resolved with weight loss   Cold intolerance -     CBC with Differential/Platelet  Hot flashes due to menopause Assessment & Plan: Experiencing menopausal symptoms, including hot flashes and sleep disturbances. She has not tried hormone therapy due to concerns about side effects. - Provide samples of Veozah  for hot flashes. - Discuss insurance coverage for Veozah  and potential use of BlinkRx for reduced cost.  Orders: -     Veozah ; Take 1 tablet (45 mg total) by mouth daily.  Elevated liver enzymes Assessment & Plan: Previous labs showed slightly elevated liver enzymes. - Check liver function with blood work.  Orders: -     Comprehensive metabolic panel with GFR  Immunization due -     Varicella-zoster vaccine IM    Meds ordered this encounter  Medications   Fezolinetant  (VEOZAH ) 45 MG TABS    Sig: Take 1 tablet (45 mg total) by mouth daily.    Orders Placed This Encounter  Procedures   Flu vaccine, recombinant, trivalent, inj   Varicella-zoster vaccine IM   CBC with Differential/Platelet   Comprehensive metabolic panel with GFR   B12 and Folate Panel   Methylmalonic acid, serum   VITAMIN D  25 Hydroxy (Vit-D Deficiency, Fractures)   TSH   Lipid panel   Hemoglobin A1c     Follow-up: Return in about 1 year (around 12/19/2024) for chronic follow up, Nurse visit in 2 months for shingles booster. Debbie Stark,acting as a scribe for Clapper Free, MD.,have documented all relevant documentation on the behalf of Clapper Free, MD,as directed by  Clapper Free, MD while in the presence of Clapper Free, MD.   An After Visit Summary was printed and given to the patient.  Clapper Free, MD [facility] [phone]

## 2023-12-20 ENCOUNTER — Ambulatory Visit (INDEPENDENT_AMBULATORY_CARE_PROVIDER_SITE_OTHER): Payer: Self-pay | Admitting: Family Medicine

## 2023-12-20 ENCOUNTER — Encounter: Payer: Self-pay | Admitting: Family Medicine

## 2023-12-20 VITALS — BP 128/72 | HR 76 | Temp 98.0°F | Ht 62.0 in | Wt 155.0 lb

## 2023-12-20 DIAGNOSIS — I10 Essential (primary) hypertension: Secondary | ICD-10-CM | POA: Diagnosis not present

## 2023-12-20 DIAGNOSIS — R748 Abnormal levels of other serum enzymes: Secondary | ICD-10-CM

## 2023-12-20 DIAGNOSIS — Z23 Encounter for immunization: Secondary | ICD-10-CM

## 2023-12-20 DIAGNOSIS — E782 Mixed hyperlipidemia: Secondary | ICD-10-CM

## 2023-12-20 DIAGNOSIS — G4733 Obstructive sleep apnea (adult) (pediatric): Secondary | ICD-10-CM

## 2023-12-20 DIAGNOSIS — K909 Intestinal malabsorption, unspecified: Secondary | ICD-10-CM | POA: Diagnosis not present

## 2023-12-20 DIAGNOSIS — R7301 Impaired fasting glucose: Secondary | ICD-10-CM | POA: Diagnosis not present

## 2023-12-20 DIAGNOSIS — Z9884 Bariatric surgery status: Secondary | ICD-10-CM

## 2023-12-20 DIAGNOSIS — R6889 Other general symptoms and signs: Secondary | ICD-10-CM

## 2023-12-20 DIAGNOSIS — N951 Menopausal and female climacteric states: Secondary | ICD-10-CM

## 2023-12-20 MED ORDER — VEOZAH 45 MG PO TABS
1.0000 | ORAL_TABLET | Freq: Every day | ORAL | Status: DC
Start: 2023-12-20 — End: 2024-01-12

## 2023-12-20 NOTE — Patient Instructions (Addendum)
  VISIT SUMMARY: You had a follow-up visit to discuss your health after bariatric surgery. We reviewed your gastrointestinal symptoms, menopausal symptoms, and other health concerns. We also discussed your preventive health measures and lifestyle.  YOUR PLAN: POST-BARIATRIC SURGERY NUTRITIONAL MONITORING: Ongoing nutritional monitoring post-bariatric surgery with occasional stomach pain related to eating habits. -Continue taking your bariatric multivitamins, biotin, and Tums for calcium  supplementation. -Continue taking 45 mg of iron once a week until you get new vitamins that include iron.  MENOPAUSAL SYMPTOMS (HOT FLASHES AND SLEEP DISTURBANCE): Experiencing menopausal symptoms, including hot flashes and sleep disturbances. You have not tried hormone therapy due to concerns about side effects. -I provided samples of Veozah  for your hot flashes. -We discussed checking your insurance coverage for Veozah  and using BlinkRx for a reduced cost.  HYPERLIPIDEMIA: High cholesterol levels noted in previous labs. -We will check your cholesterol levels with blood work.  ELEVATED LIVER ENZYMES: Previous labs showed slightly elevated liver enzymes. -We will check your liver function with blood work.  We have given you a flu shot today and a shingles vaccine today.   Follow up in 3 months based on cholesterol levels. If good, recommend follow up in 1 year.                     Contains text generated by Abridge.                                 Contains text generated by Abridge.

## 2023-12-23 LAB — CBC WITH DIFFERENTIAL/PLATELET
Basophils Absolute: 0 x10E3/uL (ref 0.0–0.2)
Basos: 1 %
EOS (ABSOLUTE): 0.1 x10E3/uL (ref 0.0–0.4)
Eos: 2 %
Hematocrit: 39.7 % (ref 34.0–46.6)
Hemoglobin: 13.1 g/dL (ref 11.1–15.9)
Immature Grans (Abs): 0 x10E3/uL (ref 0.0–0.1)
Immature Granulocytes: 0 %
Lymphocytes Absolute: 2.1 x10E3/uL (ref 0.7–3.1)
Lymphs: 41 %
MCH: 32 pg (ref 26.6–33.0)
MCHC: 33 g/dL (ref 31.5–35.7)
MCV: 97 fL (ref 79–97)
Monocytes Absolute: 0.3 x10E3/uL (ref 0.1–0.9)
Monocytes: 6 %
Neutrophils Absolute: 2.6 x10E3/uL (ref 1.4–7.0)
Neutrophils: 50 %
Platelets: 218 x10E3/uL (ref 150–450)
RBC: 4.09 x10E6/uL (ref 3.77–5.28)
RDW: 11.9 % (ref 11.7–15.4)
WBC: 5.2 x10E3/uL (ref 3.4–10.8)

## 2023-12-23 LAB — COMPREHENSIVE METABOLIC PANEL WITH GFR
ALT: 20 IU/L (ref 0–32)
AST: 31 IU/L (ref 0–40)
Albumin: 4.3 g/dL (ref 3.8–4.9)
Alkaline Phosphatase: 89 IU/L (ref 44–121)
BUN/Creatinine Ratio: 18 (ref 9–23)
BUN: 14 mg/dL (ref 6–24)
Bilirubin Total: 0.9 mg/dL (ref 0.0–1.2)
CO2: 22 mmol/L (ref 20–29)
Calcium: 9.4 mg/dL (ref 8.7–10.2)
Chloride: 101 mmol/L (ref 96–106)
Creatinine, Ser: 0.8 mg/dL (ref 0.57–1.00)
Globulin, Total: 2.3 g/dL (ref 1.5–4.5)
Glucose: 92 mg/dL (ref 70–99)
Potassium: 4.8 mmol/L (ref 3.5–5.2)
Sodium: 138 mmol/L (ref 134–144)
Total Protein: 6.6 g/dL (ref 6.0–8.5)
eGFR: 86 mL/min/1.73 (ref >59)

## 2023-12-23 LAB — B12 AND FOLATE PANEL
Folate: 15.9 ng/mL (ref >3.0)
Vitamin B-12: 649 pg/mL (ref 232–1245)

## 2023-12-23 LAB — LIPID PANEL
Chol/HDL Ratio: 3.1 ratio (ref 0.0–4.4)
Cholesterol, Total: 218 mg/dL — ABNORMAL HIGH (ref 100–199)
HDL: 71 mg/dL (ref >39)
LDL Chol Calc (NIH): 137 mg/dL — ABNORMAL HIGH (ref 0–99)
Triglycerides: 58 mg/dL (ref 0–149)
VLDL Cholesterol Cal: 10 mg/dL (ref 5–40)

## 2023-12-23 LAB — HEMOGLOBIN A1C
Est. average glucose Bld gHb Est-mCnc: 97 mg/dL
Hgb A1c MFr Bld: 5 % (ref 4.8–5.6)

## 2023-12-23 LAB — VITAMIN D 25 HYDROXY (VIT D DEFICIENCY, FRACTURES): Vit D, 25-Hydroxy: 32.6 ng/mL (ref 30.0–100.0)

## 2023-12-23 LAB — TSH: TSH: 1.88 u[IU]/mL (ref 0.450–4.500)

## 2023-12-23 LAB — METHYLMALONIC ACID, SERUM: Methylmalonic Acid: 103 nmol/L (ref 0–378)

## 2023-12-25 ENCOUNTER — Ambulatory Visit: Payer: Self-pay | Admitting: Family Medicine

## 2023-12-26 ENCOUNTER — Other Ambulatory Visit: Payer: Self-pay | Admitting: Family Medicine

## 2023-12-26 DIAGNOSIS — N951 Menopausal and female climacteric states: Secondary | ICD-10-CM | POA: Insufficient documentation

## 2023-12-26 DIAGNOSIS — R6889 Other general symptoms and signs: Secondary | ICD-10-CM | POA: Insufficient documentation

## 2023-12-26 DIAGNOSIS — Z23 Encounter for immunization: Secondary | ICD-10-CM | POA: Insufficient documentation

## 2023-12-26 DIAGNOSIS — R748 Abnormal levels of other serum enzymes: Secondary | ICD-10-CM | POA: Insufficient documentation

## 2023-12-26 NOTE — Assessment & Plan Note (Addendum)
 Ongoing nutritional monitoring post-bariatric surgery with occasional stomach pain related to eating habits. - Continue bariatric multivitamins. - Continue biotin supplementation. - Continue Tums for calcium  supplementation. - Continue iron supplementation once a week until new vitamins are obtained. Continue with healthy diet and exercise. Labs drawn today.

## 2023-12-26 NOTE — Assessment & Plan Note (Signed)
Currently taking no medication. Healthy diet and exercise.

## 2023-12-26 NOTE — Assessment & Plan Note (Signed)
 Hyperlipidemia noted in previous labs. - Check cholesterol levels with blood work. Recommend continue to work on eating healthy diet and exercise.

## 2023-12-26 NOTE — Assessment & Plan Note (Signed)
Recommend continue to work on eating healthy diet and exercise. Check A1c

## 2023-12-26 NOTE — Assessment & Plan Note (Signed)
 Check labs

## 2023-12-26 NOTE — Assessment & Plan Note (Signed)
 Previous labs showed slightly elevated liver enzymes. - Check liver function with blood work.

## 2023-12-26 NOTE — Assessment & Plan Note (Signed)
 Experiencing menopausal symptoms, including hot flashes and sleep disturbances. She has not tried hormone therapy due to concerns about side effects. - Provide samples of Veozah  for hot flashes. - Discuss insurance coverage for Veozah  and potential use of BlinkRx for reduced cost.

## 2023-12-26 NOTE — Assessment & Plan Note (Signed)
 Resolved with weight loss.

## 2024-01-12 ENCOUNTER — Telehealth: Payer: Self-pay

## 2024-01-12 ENCOUNTER — Other Ambulatory Visit: Payer: Self-pay | Admitting: Family Medicine

## 2024-01-12 DIAGNOSIS — N951 Menopausal and female climacteric states: Secondary | ICD-10-CM

## 2024-01-12 MED ORDER — VEOZAH 45 MG PO TABS: 1.0000 | ORAL_TABLET | Freq: Every day | ORAL | 3 refills | Status: DC

## 2024-01-12 NOTE — Telephone Encounter (Signed)
 Insurance requires trying and failing 3 preferred: estradiol, estradiol-norethindrone, COMBIPATCH, DUAVEE, PREMPHASE, PREMPRO.

## 2024-01-26 ENCOUNTER — Other Ambulatory Visit: Payer: Self-pay | Admitting: Family Medicine

## 2024-01-26 DIAGNOSIS — N951 Menopausal and female climacteric states: Secondary | ICD-10-CM

## 2024-01-26 MED ORDER — VEOZAH 45 MG PO TABS: 1.0000 | ORAL_TABLET | Freq: Every day | ORAL | 11 refills | Status: DC

## 2024-02-15 NOTE — Telephone Encounter (Signed)
 Called and left message for Drug Rep to call our office back.

## 2024-02-20 ENCOUNTER — Ambulatory Visit

## 2024-02-21 ENCOUNTER — Ambulatory Visit

## 2024-02-24 ENCOUNTER — Ambulatory Visit (INDEPENDENT_AMBULATORY_CARE_PROVIDER_SITE_OTHER)

## 2024-02-24 ENCOUNTER — Other Ambulatory Visit: Payer: Self-pay

## 2024-02-24 DIAGNOSIS — Z23 Encounter for immunization: Secondary | ICD-10-CM | POA: Diagnosis not present

## 2024-02-24 DIAGNOSIS — N951 Menopausal and female climacteric states: Secondary | ICD-10-CM

## 2024-02-24 DIAGNOSIS — R748 Abnormal levels of other serum enzymes: Secondary | ICD-10-CM

## 2024-02-24 LAB — COMPREHENSIVE METABOLIC PANEL WITH GFR
ALT: 33 IU/L — ABNORMAL HIGH (ref 0–32)
AST: 42 IU/L — ABNORMAL HIGH (ref 0–40)
Albumin: 4.2 g/dL (ref 3.8–4.9)
Alkaline Phosphatase: 83 IU/L (ref 49–135)
BUN/Creatinine Ratio: 17 (ref 9–23)
BUN: 15 mg/dL (ref 6–24)
Bilirubin Total: 0.6 mg/dL (ref 0.0–1.2)
CO2: 27 mmol/L (ref 20–29)
Calcium: 9.2 mg/dL (ref 8.7–10.2)
Chloride: 101 mmol/L (ref 96–106)
Creatinine, Ser: 0.88 mg/dL (ref 0.57–1.00)
Globulin, Total: 2.1 g/dL (ref 1.5–4.5)
Glucose: 82 mg/dL (ref 70–99)
Potassium: 4.9 mmol/L (ref 3.5–5.2)
Sodium: 140 mmol/L (ref 134–144)
Total Protein: 6.3 g/dL (ref 6.0–8.5)
eGFR: 77 mL/min/1.73 (ref >59)

## 2024-02-24 MED ORDER — VEOZAH 45 MG PO TABS: 1.0000 | ORAL_TABLET | Freq: Every day | ORAL | 11 refills | Status: AC

## 2024-02-24 NOTE — Progress Notes (Signed)
 Patient is in office today for a nurse visit for Immunization. Patient Injection was given in the  Left deltoid. Patient tolerated injection well.  Gave some samples of  Veozah  and prescription refill printed to take with the coupon.

## 2024-02-26 ENCOUNTER — Ambulatory Visit: Payer: Self-pay | Admitting: Family Medicine

## 2024-03-27 ENCOUNTER — Other Ambulatory Visit: Payer: Self-pay

## 2024-03-27 DIAGNOSIS — R748 Abnormal levels of other serum enzymes: Secondary | ICD-10-CM

## 2024-03-30 ENCOUNTER — Other Ambulatory Visit

## 2024-03-30 DIAGNOSIS — R748 Abnormal levels of other serum enzymes: Secondary | ICD-10-CM

## 2024-03-31 ENCOUNTER — Ambulatory Visit: Payer: Self-pay | Admitting: Family Medicine

## 2024-03-31 LAB — COMPREHENSIVE METABOLIC PANEL WITH GFR
ALT: 22 IU/L (ref 0–32)
AST: 26 IU/L (ref 0–40)
Albumin: 4.2 g/dL (ref 3.8–4.9)
Alkaline Phosphatase: 77 IU/L (ref 49–135)
BUN/Creatinine Ratio: 17 (ref 9–23)
BUN: 15 mg/dL (ref 6–24)
Bilirubin Total: 0.7 mg/dL (ref 0.0–1.2)
CO2: 26 mmol/L (ref 20–29)
Calcium: 9.4 mg/dL (ref 8.7–10.2)
Chloride: 100 mmol/L (ref 96–106)
Creatinine, Ser: 0.87 mg/dL (ref 0.57–1.00)
Globulin, Total: 2 g/dL (ref 1.5–4.5)
Glucose: 83 mg/dL (ref 70–99)
Potassium: 4.5 mmol/L (ref 3.5–5.2)
Sodium: 141 mmol/L (ref 134–144)
Total Protein: 6.2 g/dL (ref 6.0–8.5)
eGFR: 78 mL/min/1.73

## 2024-04-13 ENCOUNTER — Ambulatory Visit: Admitting: Family Medicine

## 2024-05-16 ENCOUNTER — Telehealth: Payer: Self-pay

## 2024-05-16 NOTE — Telephone Encounter (Signed)
 Can you submit a PA for Veozah  to insurance company on behalf of patient.   Copied from CRM [id number]. Topic: Clinical - Prescription Issue >> May 16, 2024 10:15 AM Kevelyn M wrote: Reason for CRM: Patient calling in because her insurance will not pay for Fezolinetant  (VEOZAH ) 45 MG TABS, she's asking for a replacement or help with this matter. She requesting for samples. She's stating the hot flashes are unbearable.   Call back # [phone]

## 2024-05-25 ENCOUNTER — Ambulatory Visit: Admitting: Family Medicine

## 2024-05-25 DIAGNOSIS — E782 Mixed hyperlipidemia: Secondary | ICD-10-CM

## 2024-12-20 ENCOUNTER — Ambulatory Visit: Admitting: Family Medicine
